# Patient Record
Sex: Female | Born: 1945 | Race: White | Hispanic: No | Smoking: Former smoker
Health system: Southern US, Community
[De-identification: ages and names within clinical notes are randomized; demographics above are authoritative.]

## PROBLEM LIST (undated history)

## (undated) DIAGNOSIS — I10 Essential (primary) hypertension: Secondary | ICD-10-CM

## (undated) DIAGNOSIS — Z972 Presence of dental prosthetic device (complete) (partial): Secondary | ICD-10-CM

## (undated) DIAGNOSIS — E785 Hyperlipidemia, unspecified: Secondary | ICD-10-CM

## (undated) DIAGNOSIS — E119 Type 2 diabetes mellitus without complications: Secondary | ICD-10-CM

## (undated) DIAGNOSIS — R42 Dizziness and giddiness: Secondary | ICD-10-CM

## (undated) DIAGNOSIS — I739 Peripheral vascular disease, unspecified: Secondary | ICD-10-CM

## (undated) DIAGNOSIS — T753XXA Motion sickness, initial encounter: Secondary | ICD-10-CM

## (undated) DIAGNOSIS — Z87891 Personal history of nicotine dependence: Principal | ICD-10-CM

## (undated) DIAGNOSIS — J439 Emphysema, unspecified: Secondary | ICD-10-CM

## (undated) DIAGNOSIS — J449 Chronic obstructive pulmonary disease, unspecified: Secondary | ICD-10-CM

## (undated) HISTORY — DX: Essential (primary) hypertension: I10

## (undated) HISTORY — DX: Peripheral vascular disease, unspecified: I73.9

## (undated) HISTORY — DX: Emphysema, unspecified: J43.9

## (undated) HISTORY — DX: Chronic obstructive pulmonary disease, unspecified: J44.9

## (undated) HISTORY — DX: Personal history of nicotine dependence: Z87.891

## (undated) HISTORY — DX: Hyperlipidemia, unspecified: E78.5

---

## 1965-03-13 HISTORY — PX: APPENDECTOMY: SHX54

## 1978-03-13 HISTORY — PX: ABDOMINAL HYSTERECTOMY: SHX81

## 2000-01-16 ENCOUNTER — Other Ambulatory Visit: Admission: RE | Admit: 2000-01-16 | Discharge: 2000-01-16 | Payer: Self-pay | Admitting: Family Medicine

## 2003-08-19 ENCOUNTER — Encounter: Admission: RE | Admit: 2003-08-19 | Discharge: 2003-08-19 | Payer: Self-pay | Admitting: Family Medicine

## 2004-10-11 ENCOUNTER — Other Ambulatory Visit: Admission: RE | Admit: 2004-10-11 | Discharge: 2004-10-11 | Payer: Self-pay | Admitting: Family Medicine

## 2004-11-03 ENCOUNTER — Ambulatory Visit: Payer: Self-pay | Admitting: Urology

## 2005-07-13 ENCOUNTER — Ambulatory Visit (HOSPITAL_COMMUNITY): Admission: RE | Admit: 2005-07-13 | Discharge: 2005-07-13 | Payer: Self-pay | Admitting: Family Medicine

## 2005-07-13 ENCOUNTER — Encounter: Payer: Self-pay | Admitting: Vascular Surgery

## 2006-10-12 HISTORY — PX: NM MYOCAR PERF WALL MOTION: HXRAD629

## 2006-11-05 ENCOUNTER — Encounter: Admission: RE | Admit: 2006-11-05 | Discharge: 2006-11-05 | Payer: Self-pay | Admitting: Cardiovascular Disease

## 2006-11-15 ENCOUNTER — Ambulatory Visit (HOSPITAL_COMMUNITY): Admission: RE | Admit: 2006-11-15 | Discharge: 2006-11-15 | Payer: Self-pay | Admitting: Physician Assistant

## 2006-11-22 ENCOUNTER — Ambulatory Visit (HOSPITAL_COMMUNITY): Admission: RE | Admit: 2006-11-22 | Discharge: 2006-11-22 | Payer: Self-pay | Admitting: Cardiovascular Disease

## 2006-12-19 ENCOUNTER — Observation Stay (HOSPITAL_COMMUNITY): Admission: RE | Admit: 2006-12-19 | Discharge: 2006-12-20 | Payer: Self-pay | Admitting: Cardiovascular Disease

## 2006-12-19 HISTORY — PX: ABDOMINAL AORTA STENT: SHX1108

## 2010-07-26 NOTE — Discharge Summary (Signed)
NAME:  [name] [name]                ACCOUNT NO.:  [id number]   MEDICAL RECORD NO.:  [id number]          PATIENT TYPE:  OBV   LOCATION:  6533                         FACILITY:  [facility]   PHYSICIAN:  Nanetta Batty, M.D.   DATE OF BIRTH:  [birth year]   DATE OF ADMISSION:  12/19/2006  DATE OF DISCHARGE:  12/20/2006                               DISCHARGE SUMMARY   DISCHARGE DIAGNOSES:  1. Claudication secondary to #2.  2. 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     PTA and stent to the distal abdominal aorta by Dr. Allyson Sabal  3. Hypertension.  4. Hyperlipidemia.  5. Gastroesophageal reflux disease  6. History of chronic hematuria.  6,  Tobacco abuse, but this was down to three cigarettes and plans to  stop cold [place].   DISCHARGE CONDITION:  Stable.   PROCEDURES:  PTA and stent to the distal aorta by Dr. Allyson Sabal and Dr.  Jacinto Halim.   DISCHARGE MEDICATIONS:  1. Lisinopril 10 mg daily.  2. Crestor 10 mg daily  3. Omeprazole 20 mg daily.  4. Enablex 15 mg daily  5. Multivitamin daily.  6. Aspirin 325 daily.  Stop Plavix 75 mg daily, new   DISCHARGE INSTRUCTIONS:  1. Increase activity slowly.  May shower or bathe.  No lifting for 2      days, no driving for 2 days, no sexual activity for 2 days  2. Low-sodium heart-healthy diet.  3. Wash cath site with soap and water.  Call if any bleeding, swelling      or drainage.  4. Follow up with Dr. Allyson Sabal in 2 weeks, office will call with date and      time and the office will also call to schedule you for lower      extremity arterial Dopplers, ABIs and abdominal aorta Doppler.   HISTORY OF PRESENT ILLNESS:  This is a 65 year old white female patient  referred to Dr. Allyson Sabal by Dr. Al Corpus secondary to abnormal  Doppler's.  She had had 2 years of claudication complaints.  She would start walking  and it would go from the ankles up into the hips, burning and pulling  pain in both legs.   Initially she thought it was worse in the left but  now it is pretty equal bilaterally.  She can only walk 50 yards before  she has to stop and rest. It never occurs at rest, only with exertion.  She had bilateral ABIs and Dopplers and distal abdominal aorta suggest  stenosis of greater than 78% diameter reduction.   The patient was seen by Dr. Allyson Sabal in August, underwent PV abdominal  aortogram by Dr. Allyson Sabal on September 4 which revealed intimal flap  hemodynamically significant with claudication in the distal aorta and  she would need stenting.   Additionally the patient had complained of some chest burning.  She  underwent a Persantine Myoview which was negative for ischemia.  EF was  77%.   Past medical history as above.  Additionally, hypertension,  hyperlipidemia,  gastroesophageal reflux disease and chronic hematuria.   Review of systems, outpatient medications, family history, social  history please see H&P of November 05, 2006.   ALLERGIES:  To CODEINE.   PHYSICAL EXAM AT DISCHARGE:  Blood pressure 119/44, heart rate 60,  temperature 97.8,  right groin stable.  No hematoma.  Lungs were clear.  Heart: Normal S1-S2.   LABORATORY DATA:  Postprocedure hemoglobin 13.9, hematocrit 40.3, WBC  8.9, platelets 204, MCV 89.1.  Chemistry, sodium 141, potassium 4.6,  chloride 107, CO2 30, BUN 11, creatinine 0.85, glucose 115 and calcium  9.4.   HOSPITAL COURSE:  The patient presented, underwent PTA and stent to the  distal aorta and tolerated the procedure well.  By the next morning she  was ambulating without problems.  Next morning stable and ready for  discharge home.  She will follow up with Dr. Allyson Sabal and her  primary care  of Dr. Lianne Bushy.      Darcella Gasman. Annie Paras, N.P.      Nanetta Batty, M.D.  Electronically Signed    LRI/MEDQ  D:  12/20/2006  T:  12/20/2006  Job:  [id number]   cc:   Lianne Bushy, M.D.

## 2010-07-26 NOTE — Cardiovascular Report (Signed)
NAME:  [name] [name], [name] [name]                ACCOUNT NO.:  [id number]   MEDICAL RECORD NO.:  [id number]          PATIENT TYPE:  AMB   LOCATION:  SDS                          FACILITY:  [facility]   PHYSICIAN:  Nanetta Batty, M.D.   DATE OF BIRTH:  [birth year]   DATE OF PROCEDURE:  DATE OF DISCHARGE:                            CARDIAC CATHETERIZATION   [name] [name] is a 65 year old female referred to me for claudication.  She  had Dopplers which revealed ABIs in the 0.7-0.8 range with a high  frequency signal in the distal aorta.  Her other problems include  hypertension, hyperlipidemia and family history.  She had a negative  Myoview.  She presents now for angiography and potential intervention.   DESCRIPTION OF PROCEDURE:  The patient brought to the second floor [facility] Suite in the postabsorptive state.  She is  premedicated p.o. Valium.  Her right groin was prepped and shaved in the  usual sterile fashion, 1% Xylocaine was used for local anesthesia.  A 5-  French sheath was inserted into the right femoral artery using standard  Seldinger technique.  A 5-French tennis racket catheter was used for  midstream and distal abdominal aortography with distal runoff using  bolus chase digital subtraction step table technique.  Visipaque dye was  used for the entirety of the case.  Aortic pressures monitored in the  case.   ANGIOGRAPHIC RESULTS:  1. Abdominal aorta.      a.     Renal arteries:  Normal.      b.     Infrarenal abdominal aorta:  There was a filling defect in       the distal aorta which appeared to be an intimal flap which was       mobile and moved with systolic and diastole.      c.     Iliac bifurcation:  No evidence of disease.  2. Left lower extremity:  Normal with two-vessel runoff.  3. Right lower extremity:  Normal with three-vessel runoff.   IMPRESSION:  [name] [name] has a freely mobile though attached intimal flap  in the distal aorta, probably responsible for  her claudication.  She  does have a high frequency signal by duplex ultrasound.  A pullback  gradient across this using a 5-French end-hole catheter after  administration of 200 mcg intra-arterial nitroglycerin revealed a 30-35  mm gradient.   Probably significant intimal flap with obstruction of flow.  We will  proceed with intravascular ultrasound.   The sheath was upgraded to a [place] sheath.  The IVUS catheter was  advanced past the lesion and a pullback was performed.  There was an  intimal flap noted.  The aortic measurements at this level were 14 x 14  mm.  Slightly more distally, the aorta tapered down to 10 mm.   IMPRESSION:  Intimal flap hemodynamically significant with claudication.  The patient is a good candidate to undergo stenting using a nitinol 14 x  40 mm self-expanding stent.  I am going to review the angiographic and  IVUS pictures with my partners prior  to scheduling this.   Sheath was removed and pressure to the groin to achieve hemostasis.  The  patient left the lab in stable condition.  She will be discharged home  as an outpatient today and will see me back in the office next week in  followup.  She left the lab in stable condition.      Nanetta Batty, M.D.  Electronically Signed     JB/MEDQ  D:  11/15/2006  T:  11/15/2006  Job:  [id number]   cc:   Second Floor [place] [facility]  [facility]  Max T. Hyatt, D.P.M.  Lianne Bushy, M.D.

## 2010-07-26 NOTE — Cardiovascular Report (Signed)
NAME:  [name] [name], [name] [name]                ACCOUNT NO.:  [id number]   MEDICAL RECORD NO.:  [id number]          PATIENT TYPE:  OBV   LOCATION:  2807                         FACILITY:  [facility]   PHYSICIAN:  Nanetta Batty, M.D.   DATE OF BIRTH:  1946-01-07   DATE OF PROCEDURE:  12/19/2006  DATE OF DISCHARGE:                            CARDIAC CATHETERIZATION   HISTORY OF PRESENT ILLNESS:  Ms. Mumme is a 65 year old female referred  by Dr. Purnell Shoemaker for claudication.  Abdominal aortogram revealed distal  abdominal aortic flap which is confirmed by IBIS, pullback gradient and  CT angio.  She presents now for stenting using a nitinol self-expanding  stent and angioplasty.   DESCRIPTION OF PROCEDURE:  The patient brought to the second floor [facility] PV angiographic suite in postabsorptive state.  She was  premedicated with p.o. Valium, IV Versed and fentanyl.  Right groin was  prepped and shaved in the usual sterile fashion, and 1% Xylocaine was  used for local anesthesia.  An 8-French long bright tip sheath was  inserted into the right femoral artery using standard Seldinger  technique.  A 5-French pigtail catheter was used for abdominal  aortography using digital subtraction technique.  The patient received  5000 units of heparin intravenously.  Visipaque dye was used throughout  the entirety of the case.  Retrograde aortic pressure was monitored  during the case.   Using a 4 x 14 Smart nitinol self-expanding stent.  Stenting was  performed.  This was postdilated with a 12.2 Powerflex at 4 atmospheres  and post procedure IBIS.  Interrogation revealed the stent be adequately  opposed.  The residual lumen at the site of the lesion was approximately  9 x 11 mm.   IMPRESSION:  Successful PTA and stenting using a nitinol self-expanding  stent with IBIS guidance.  The patient tolerated procedure well.  There  was a long sheath exchanged for short 8-French sheath.  It was sewn  securely in place.   ACT was greater than 200.  It will be removed once  ACT falls below 150.  The patient will be discharged home in the morning  with follow-up Dopplers and ABIs . We will see her back in the office.      Nanetta Batty, M.D.  Electronically Signed     JB/MEDQ  D:  12/19/2006  T:  12/19/2006  Job:  [id number]   cc:   Lianne Bushy, M.D.  [facility] [employer]

## 2010-12-22 LAB — BASIC METABOLIC PANEL
BUN: 11
CO2: 30
Calcium: 9.4
Chloride: 107
Creatinine, Ser: 0.85
Glucose, Bld: 115 — ABNORMAL HIGH

## 2010-12-22 LAB — CBC
MCHC: 34.6
MCV: 89.1
RDW: 12.9
WBC: 8.9

## 2010-12-23 LAB — BASIC METABOLIC PANEL
Calcium: 9
Chloride: 105
GFR calc non Af Amer: >60

## 2010-12-23 LAB — CBC
Hemoglobin: 14.5
MCV: 88
RDW: 12.7

## 2010-12-23 LAB — PROTIME-INR: INR: 0.9

## 2011-04-18 DIAGNOSIS — Z1231 Encounter for screening mammogram for malignant neoplasm of breast: Secondary | ICD-10-CM | POA: Diagnosis not present

## 2011-04-24 DIAGNOSIS — N6489 Other specified disorders of breast: Secondary | ICD-10-CM | POA: Diagnosis not present

## 2011-04-24 DIAGNOSIS — R928 Other abnormal and inconclusive findings on diagnostic imaging of breast: Secondary | ICD-10-CM | POA: Diagnosis not present

## 2011-05-24 DIAGNOSIS — I739 Peripheral vascular disease, unspecified: Secondary | ICD-10-CM | POA: Diagnosis not present

## 2011-05-29 DIAGNOSIS — I739 Peripheral vascular disease, unspecified: Secondary | ICD-10-CM | POA: Diagnosis not present

## 2011-05-29 DIAGNOSIS — I10 Essential (primary) hypertension: Secondary | ICD-10-CM | POA: Diagnosis not present

## 2011-05-29 DIAGNOSIS — E782 Mixed hyperlipidemia: Secondary | ICD-10-CM | POA: Diagnosis not present

## 2011-08-21 DIAGNOSIS — R7309 Other abnormal glucose: Secondary | ICD-10-CM | POA: Diagnosis not present

## 2011-08-21 DIAGNOSIS — E041 Nontoxic single thyroid nodule: Secondary | ICD-10-CM | POA: Diagnosis not present

## 2011-08-21 DIAGNOSIS — I10 Essential (primary) hypertension: Secondary | ICD-10-CM | POA: Diagnosis not present

## 2011-08-21 DIAGNOSIS — E782 Mixed hyperlipidemia: Secondary | ICD-10-CM | POA: Diagnosis not present

## 2011-08-22 DIAGNOSIS — D485 Neoplasm of uncertain behavior of skin: Secondary | ICD-10-CM | POA: Diagnosis not present

## 2011-08-22 DIAGNOSIS — H2510 Age-related nuclear cataract, unspecified eye: Secondary | ICD-10-CM | POA: Diagnosis not present

## 2011-08-30 DIAGNOSIS — R918 Other nonspecific abnormal finding of lung field: Secondary | ICD-10-CM | POA: Diagnosis not present

## 2011-08-30 DIAGNOSIS — J438 Other emphysema: Secondary | ICD-10-CM | POA: Diagnosis not present

## 2011-08-30 DIAGNOSIS — I2510 Atherosclerotic heart disease of native coronary artery without angina pectoris: Secondary | ICD-10-CM | POA: Diagnosis not present

## 2011-08-30 DIAGNOSIS — K449 Diaphragmatic hernia without obstruction or gangrene: Secondary | ICD-10-CM | POA: Diagnosis not present

## 2011-11-15 DIAGNOSIS — E042 Nontoxic multinodular goiter: Secondary | ICD-10-CM | POA: Diagnosis not present

## 2012-01-12 DIAGNOSIS — Z23 Encounter for immunization: Secondary | ICD-10-CM | POA: Diagnosis not present

## 2012-02-21 DIAGNOSIS — I10 Essential (primary) hypertension: Secondary | ICD-10-CM | POA: Diagnosis not present

## 2012-02-21 DIAGNOSIS — E782 Mixed hyperlipidemia: Secondary | ICD-10-CM | POA: Diagnosis not present

## 2012-02-21 DIAGNOSIS — M810 Age-related osteoporosis without current pathological fracture: Secondary | ICD-10-CM | POA: Diagnosis not present

## 2012-02-21 DIAGNOSIS — R7309 Other abnormal glucose: Secondary | ICD-10-CM | POA: Diagnosis not present

## 2012-03-19 DIAGNOSIS — R918 Other nonspecific abnormal finding of lung field: Secondary | ICD-10-CM | POA: Diagnosis not present

## 2012-04-28 ENCOUNTER — Emergency Department: Payer: Self-pay | Admitting: Emergency Medicine

## 2012-04-28 DIAGNOSIS — Z79899 Other long term (current) drug therapy: Secondary | ICD-10-CM | POA: Diagnosis not present

## 2012-04-28 DIAGNOSIS — R5381 Other malaise: Secondary | ICD-10-CM | POA: Diagnosis not present

## 2012-04-28 DIAGNOSIS — R42 Dizziness and giddiness: Secondary | ICD-10-CM | POA: Diagnosis not present

## 2012-04-28 DIAGNOSIS — Z9861 Coronary angioplasty status: Secondary | ICD-10-CM | POA: Diagnosis not present

## 2012-04-28 LAB — URINALYSIS, COMPLETE
Bacteria: NONE SEEN
Bilirubin,UR: NEGATIVE
Ketone: NEGATIVE
Ph: 6 (ref 4.5–8.0)
Protein: NEGATIVE
RBC,UR: <1 /HPF (ref 0–5)
WBC UR: <1 /HPF (ref 0–5)

## 2012-04-28 LAB — CBC
HGB: 14.6 g/dL (ref 12.0–16.0)
MCH: 30.2 pg (ref 26.0–34.0)
MCV: 89 fL (ref 80–100)
RBC: 4.85 10*6/uL (ref 3.80–5.20)
WBC: 9.7 10*3/uL (ref 3.6–11.0)

## 2012-04-28 LAB — COMPREHENSIVE METABOLIC PANEL
Albumin: 4.1 g/dL (ref 3.4–5.0)
Anion Gap: 9 (ref 7–16)
Chloride: 112 mmol/L — ABNORMAL HIGH (ref 98–107)
EGFR (African American): >60
Glucose: 94 mg/dL (ref 65–99)
Osmolality: 284 (ref 275–301)
SGOT(AST): 22 U/L (ref 15–37)
SGPT (ALT): 21 U/L (ref 12–78)
Sodium: 143 mmol/L (ref 136–145)
Total Protein: 7.7 g/dL (ref 6.4–8.2)

## 2012-05-03 ENCOUNTER — Other Ambulatory Visit (HOSPITAL_COMMUNITY): Payer: Self-pay | Admitting: Cardiology

## 2012-05-03 DIAGNOSIS — E782 Mixed hyperlipidemia: Secondary | ICD-10-CM | POA: Diagnosis not present

## 2012-05-03 DIAGNOSIS — I10 Essential (primary) hypertension: Secondary | ICD-10-CM | POA: Diagnosis not present

## 2012-05-03 DIAGNOSIS — I739 Peripheral vascular disease, unspecified: Secondary | ICD-10-CM

## 2012-05-03 DIAGNOSIS — I7389 Other specified peripheral vascular diseases: Secondary | ICD-10-CM | POA: Diagnosis not present

## 2012-05-08 DIAGNOSIS — Z13820 Encounter for screening for osteoporosis: Secondary | ICD-10-CM | POA: Diagnosis not present

## 2012-05-08 DIAGNOSIS — Z1231 Encounter for screening mammogram for malignant neoplasm of breast: Secondary | ICD-10-CM | POA: Diagnosis not present

## 2012-05-08 DIAGNOSIS — N958 Other specified menopausal and perimenopausal disorders: Secondary | ICD-10-CM | POA: Diagnosis not present

## 2012-05-08 DIAGNOSIS — N951 Menopausal and female climacteric states: Secondary | ICD-10-CM | POA: Diagnosis not present

## 2012-05-08 DIAGNOSIS — E2839 Other primary ovarian failure: Secondary | ICD-10-CM | POA: Diagnosis not present

## 2012-06-11 ENCOUNTER — Encounter (HOSPITAL_COMMUNITY): Payer: Self-pay

## 2012-08-21 DIAGNOSIS — Z9181 History of falling: Secondary | ICD-10-CM | POA: Diagnosis not present

## 2012-08-21 DIAGNOSIS — E782 Mixed hyperlipidemia: Secondary | ICD-10-CM | POA: Diagnosis not present

## 2012-08-21 DIAGNOSIS — Z6826 Body mass index (BMI) 26.0-26.9, adult: Secondary | ICD-10-CM | POA: Diagnosis not present

## 2012-08-21 DIAGNOSIS — I10 Essential (primary) hypertension: Secondary | ICD-10-CM | POA: Diagnosis not present

## 2012-08-21 DIAGNOSIS — E042 Nontoxic multinodular goiter: Secondary | ICD-10-CM | POA: Diagnosis not present

## 2012-08-21 DIAGNOSIS — Z1331 Encounter for screening for depression: Secondary | ICD-10-CM | POA: Diagnosis not present

## 2012-08-21 DIAGNOSIS — R7309 Other abnormal glucose: Secondary | ICD-10-CM | POA: Diagnosis not present

## 2012-08-26 DIAGNOSIS — E119 Type 2 diabetes mellitus without complications: Secondary | ICD-10-CM | POA: Diagnosis not present

## 2012-09-10 DIAGNOSIS — H2510 Age-related nuclear cataract, unspecified eye: Secondary | ICD-10-CM | POA: Diagnosis not present

## 2012-09-11 ENCOUNTER — Ambulatory Visit (HOSPITAL_COMMUNITY)
Admission: RE | Admit: 2012-09-11 | Discharge: 2012-09-11 | Disposition: A | Discharge status: Home / Self Care | Payer: Medicare Other | Source: Ambulatory Visit | Attending: Internal Medicine | Admitting: Internal Medicine

## 2012-09-11 ENCOUNTER — Encounter (HOSPITAL_COMMUNITY): Payer: Self-pay

## 2012-09-11 DIAGNOSIS — I739 Peripheral vascular disease, unspecified: Secondary | ICD-10-CM | POA: Insufficient documentation

## 2012-09-11 DIAGNOSIS — I70219 Atherosclerosis of native arteries of extremities with intermittent claudication, unspecified extremity: Secondary | ICD-10-CM

## 2012-09-11 NOTE — Progress Notes (Signed)
Arterial Duplex Lower Ext. Completed. Zoua Caporaso, RDMS, RVT  

## 2012-09-16 ENCOUNTER — Ambulatory Visit: Payer: Self-pay | Admitting: Family Medicine

## 2012-09-16 DIAGNOSIS — E119 Type 2 diabetes mellitus without complications: Secondary | ICD-10-CM | POA: Diagnosis not present

## 2012-09-16 DIAGNOSIS — Z7189 Other specified counseling: Secondary | ICD-10-CM | POA: Diagnosis not present

## 2012-10-11 ENCOUNTER — Ambulatory Visit: Payer: Self-pay | Admitting: Family Medicine

## 2012-10-11 DIAGNOSIS — Z7189 Other specified counseling: Secondary | ICD-10-CM | POA: Diagnosis not present

## 2012-10-11 DIAGNOSIS — E119 Type 2 diabetes mellitus without complications: Secondary | ICD-10-CM | POA: Diagnosis not present

## 2012-11-11 ENCOUNTER — Ambulatory Visit: Payer: Self-pay | Admitting: Family Medicine

## 2012-11-27 DIAGNOSIS — E782 Mixed hyperlipidemia: Secondary | ICD-10-CM | POA: Diagnosis not present

## 2012-11-27 DIAGNOSIS — E119 Type 2 diabetes mellitus without complications: Secondary | ICD-10-CM | POA: Diagnosis not present

## 2012-11-27 DIAGNOSIS — I10 Essential (primary) hypertension: Secondary | ICD-10-CM | POA: Diagnosis not present

## 2013-01-23 DIAGNOSIS — Z23 Encounter for immunization: Secondary | ICD-10-CM | POA: Diagnosis not present

## 2013-03-31 DIAGNOSIS — I10 Essential (primary) hypertension: Secondary | ICD-10-CM | POA: Diagnosis not present

## 2013-03-31 DIAGNOSIS — L989 Disorder of the skin and subcutaneous tissue, unspecified: Secondary | ICD-10-CM | POA: Diagnosis not present

## 2013-03-31 DIAGNOSIS — E782 Mixed hyperlipidemia: Secondary | ICD-10-CM | POA: Diagnosis not present

## 2013-03-31 DIAGNOSIS — E119 Type 2 diabetes mellitus without complications: Secondary | ICD-10-CM | POA: Diagnosis not present

## 2013-03-31 DIAGNOSIS — Z9181 History of falling: Secondary | ICD-10-CM | POA: Diagnosis not present

## 2013-03-31 DIAGNOSIS — Z1331 Encounter for screening for depression: Secondary | ICD-10-CM | POA: Diagnosis not present

## 2013-04-04 DIAGNOSIS — C44621 Squamous cell carcinoma of skin of unspecified upper limb, including shoulder: Secondary | ICD-10-CM | POA: Diagnosis not present

## 2013-04-04 DIAGNOSIS — D485 Neoplasm of uncertain behavior of skin: Secondary | ICD-10-CM | POA: Diagnosis not present

## 2013-05-27 DIAGNOSIS — Z1231 Encounter for screening mammogram for malignant neoplasm of breast: Secondary | ICD-10-CM | POA: Diagnosis not present

## 2013-05-28 DIAGNOSIS — Z6825 Body mass index (BMI) 25.0-25.9, adult: Secondary | ICD-10-CM | POA: Diagnosis not present

## 2013-05-28 DIAGNOSIS — C44621 Squamous cell carcinoma of skin of unspecified upper limb, including shoulder: Secondary | ICD-10-CM | POA: Diagnosis not present

## 2013-05-28 DIAGNOSIS — D485 Neoplasm of uncertain behavior of skin: Secondary | ICD-10-CM | POA: Diagnosis not present

## 2013-06-17 DIAGNOSIS — E119 Type 2 diabetes mellitus without complications: Secondary | ICD-10-CM | POA: Diagnosis not present

## 2013-06-23 IMAGING — CT CT HEAD WITHOUT CONTRAST
1 series · 16 of 30 positions shown, 20 images · non-contrast
Comparison: none

REASON FOR EXAM: dizzy
COMMENTS:

PROCEDURE:     CT  - CT HEAD WITHOUT CONTRAST  - April 28, 2012  [DATE]
RESULT:     Comparison:  None
TECHNIQUE: Multiple axial images from the foramen magnum to the vertex were
obtained without IV contrast.

[Series 2: soft tissue · axial · 0.44mm/px · z∈[-176,-42]mm · 16 of 31 slices shown, 20 images]
[im 2/31  brain]
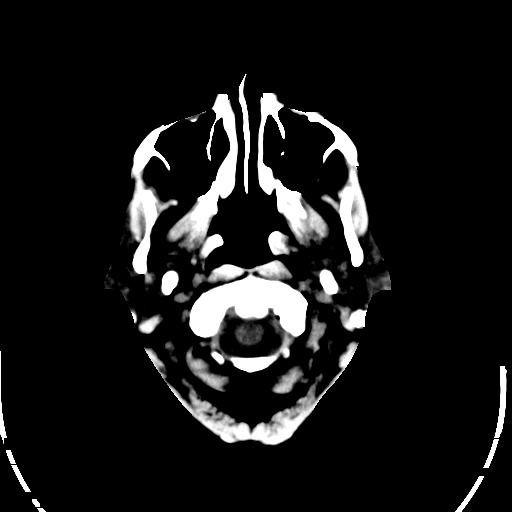
[im 2/31  bone]
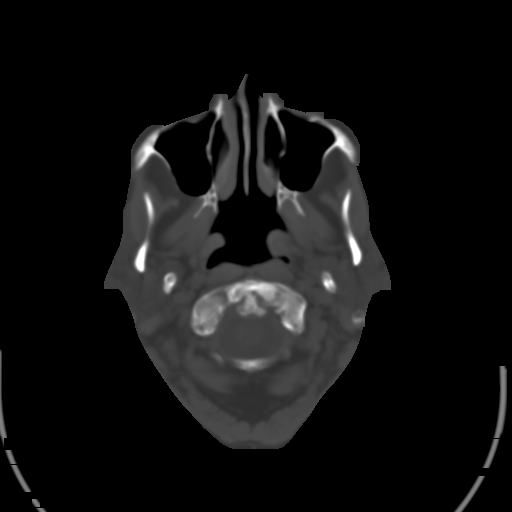
[im 4/31  brain]
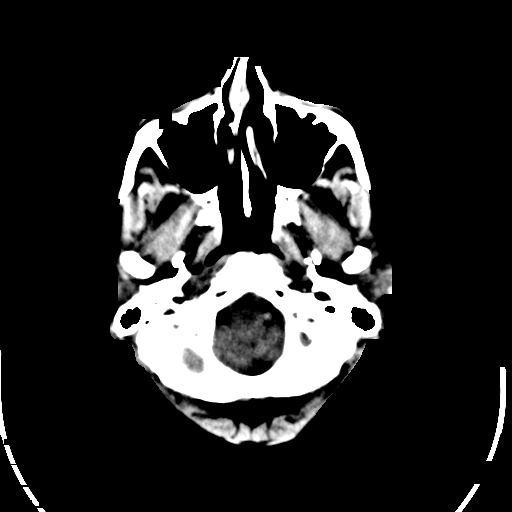
[im 6/31  brain]
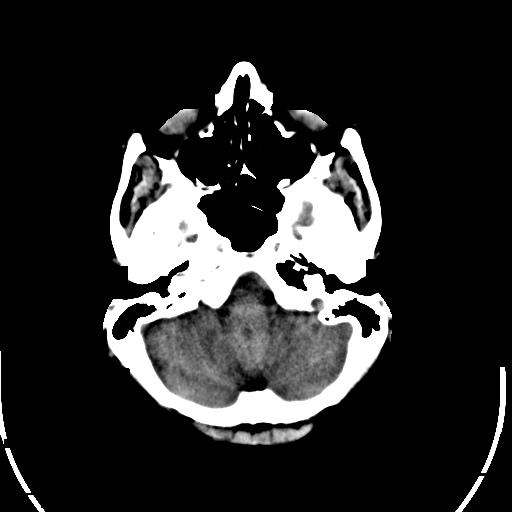
[im 8/31  brain]
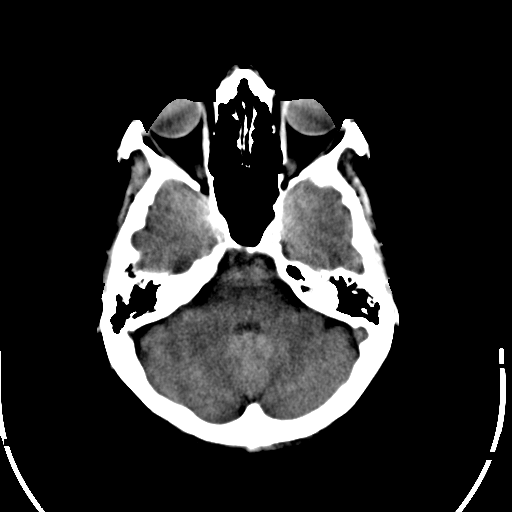
[im 9/31  brain]
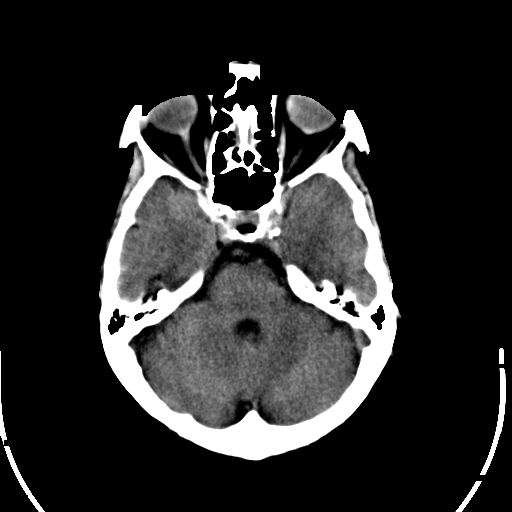
[im 9/31  bone]
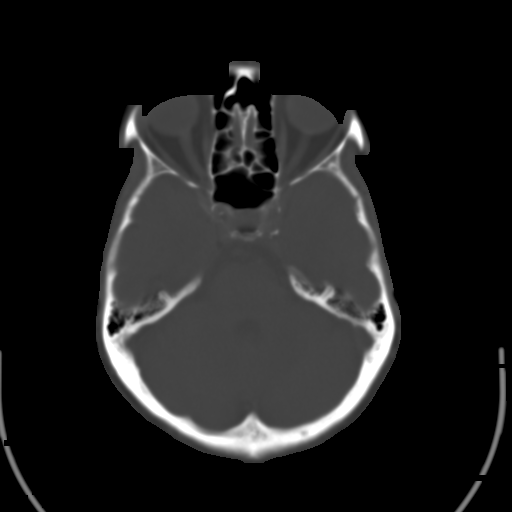
[im 11/31  brain]
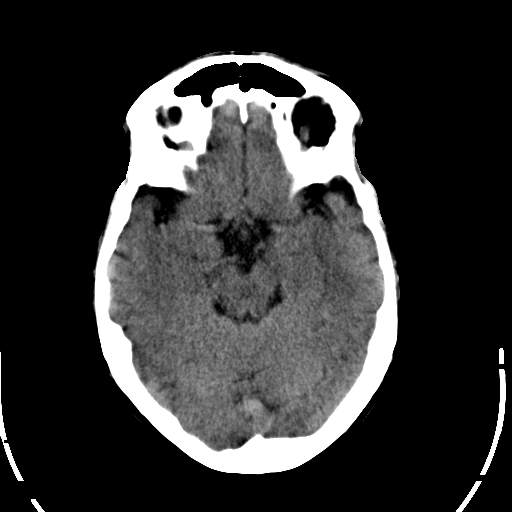
[im 13/31  brain]
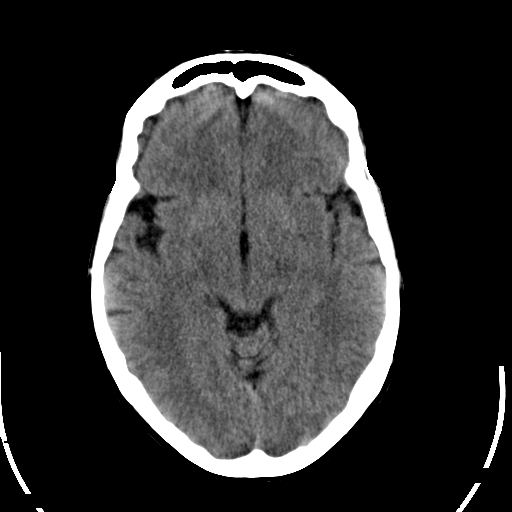
[im 15/31  brain]
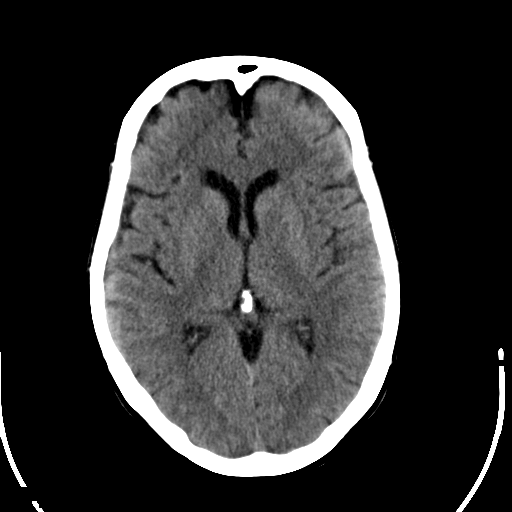
[im 16/31  brain]
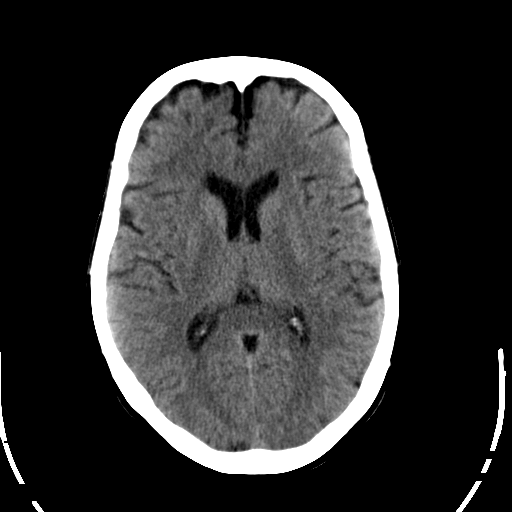
[im 16/31  bone]
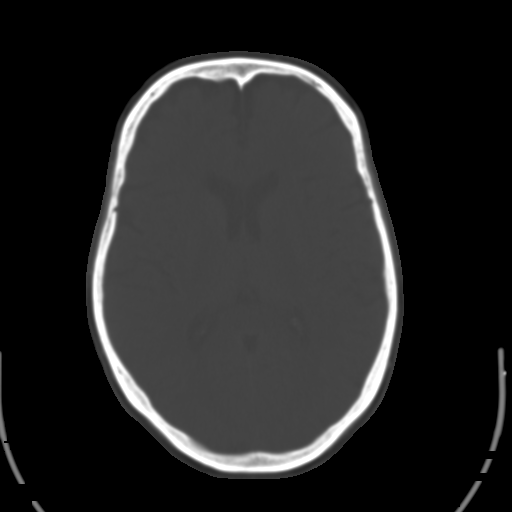
[im 18/31  brain]
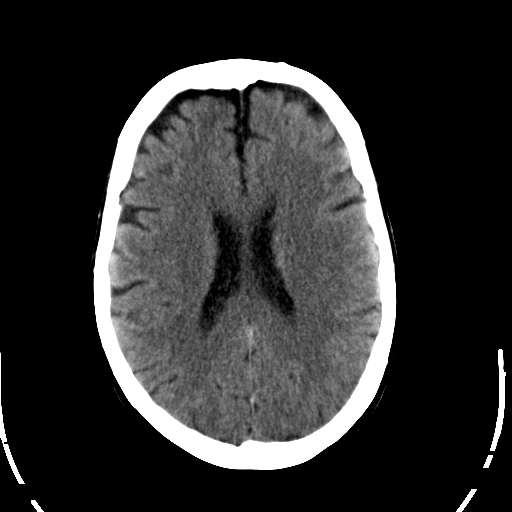
[im 20/31  brain]
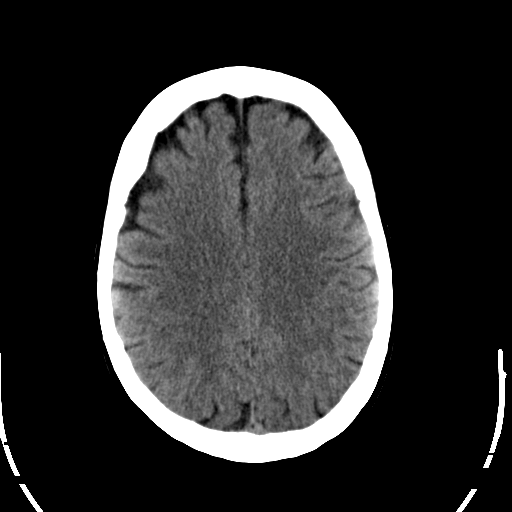
[im 22/31  brain]
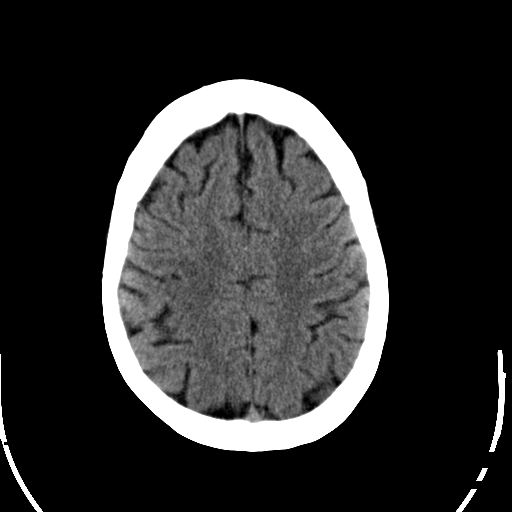
[im 23/31  brain]
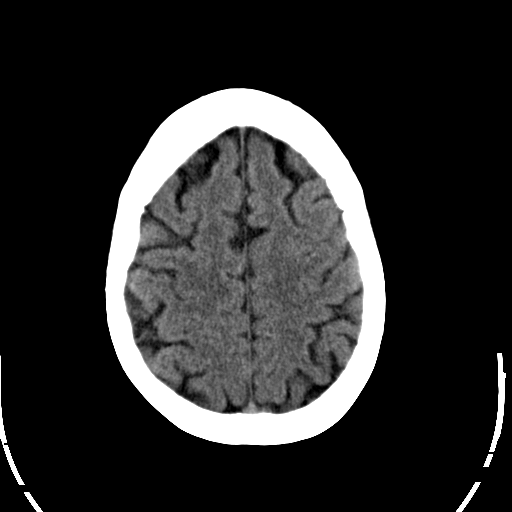
[im 23/31  bone]
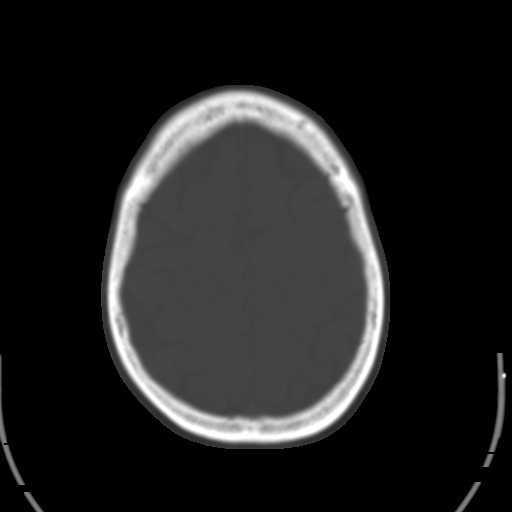
[im 25/31  brain]
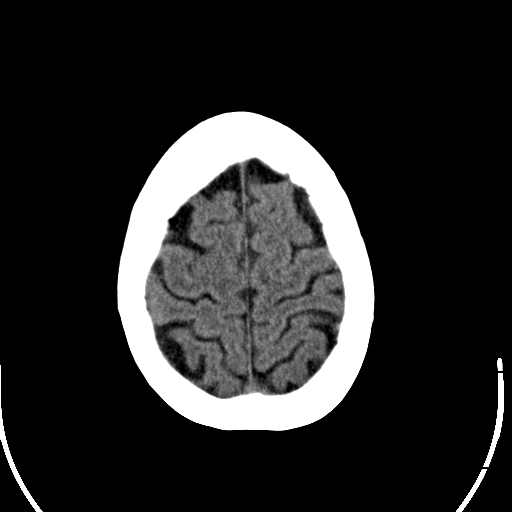
[im 27/31  brain]
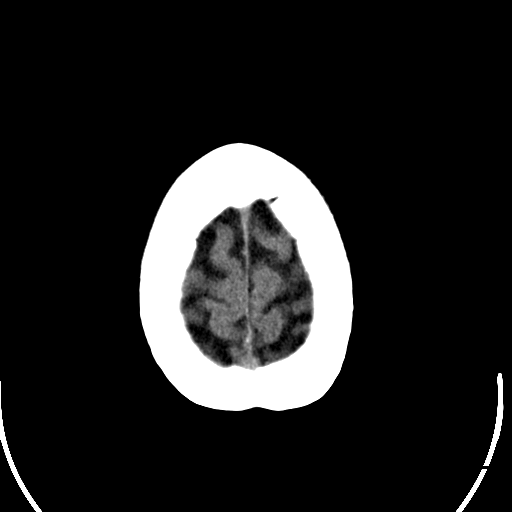
[im 29/31  brain]
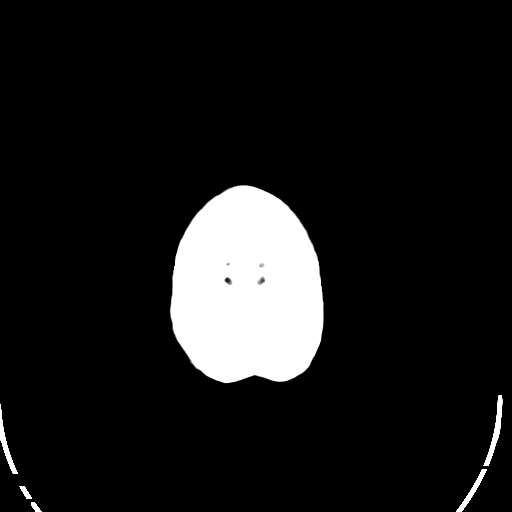

[16 of 30 positions shown; findings below may reference images not displayed]

FINDINGS: There is no evidence of mass effect, midline shift, or extra-axial fluid
collections.  There is no evidence of a space-occupying lesion or
intracranial hemorrhage. There is no evidence of a cortical-based area of
acute infarction. There is generalized cerebral atrophy. There is
periventricular white matter low attenuation likely secondary to
microangiopathy.

The ventricles and sulci are appropriate for the patient's age. The basal
cisterns are patent.

Visualized portions of the orbits are unremarkable. The visualized portions
of the paranasal sinuses and mastoid air cells are unremarkable.
Cerebrovascular atherosclerotic calcifications are noted.

The osseous structures are unremarkable.
IMPRESSION: No acute intracranial process.

[REDACTED]

## 2013-06-25 ENCOUNTER — Ambulatory Visit: Payer: Medicare Other | Admitting: Cardiovascular Disease

## 2013-07-15 ENCOUNTER — Encounter: Payer: Self-pay | Admitting: *Deleted

## 2013-07-16 ENCOUNTER — Ambulatory Visit (INDEPENDENT_AMBULATORY_CARE_PROVIDER_SITE_OTHER): Payer: Medicare Other | Admitting: Cardiovascular Disease

## 2013-07-16 ENCOUNTER — Encounter: Payer: Self-pay | Admitting: Cardiovascular Disease

## 2013-07-16 VITALS — BP 122/60 | HR 68 | Ht 64.0 in | Wt 151.3 lb

## 2013-07-16 DIAGNOSIS — I10 Essential (primary) hypertension: Secondary | ICD-10-CM | POA: Diagnosis not present

## 2013-07-16 DIAGNOSIS — E785 Hyperlipidemia, unspecified: Secondary | ICD-10-CM | POA: Insufficient documentation

## 2013-07-16 DIAGNOSIS — I739 Peripheral vascular disease, unspecified: Secondary | ICD-10-CM | POA: Insufficient documentation

## 2013-07-16 NOTE — Assessment & Plan Note (Signed)
Status post distal abdominal aortic stenting by myself 12/19/06 with a megadose of expanding stent because of a distal abdominal aortic dissection with ultimate improvement in her symptoms and Dopplers. She currently denies claudication

## 2013-07-16 NOTE — Assessment & Plan Note (Signed)
On statin therapy followed by her PCP 

## 2013-07-16 NOTE — Assessment & Plan Note (Signed)
Controlled on current medications 

## 2013-07-16 NOTE — Progress Notes (Signed)
07/16/2013 [name] [name]   06/18/1945  [id number]  Primary Physician Leonides Sake, MD Primary Cardiologist: Lorretta Harp MD [name] [name]   HPI:  The patient is a 68 year old, thin-appearing, widowed Caucasian female, mother of 1 child, grandmother to 2 grandchildren who I saw approximately 2 years ago. She has a history of discontinued tobacco abuse, having stopped on September 10, 2009. She did have 45-pack-year history prior to that. She has hypertension and hyperlipidemia as well. Because of claudication, I angiogram'd her on November 15, 2006, revealing a distal abdominal aortic flap which I ultimately stented 1 month later with a 4-x-14 SMART Nitinol self-expanding stent. Followup Dopplers were normal and her claudication resolved. Dr. Lisbeth Ply follows her lipid profile. She does get short of breath probably related to COPD. She had a Myoview stress test performed in 2008 which was normal. Dopplers performed May 24, 2011, revealed ABIs of 1 bilaterally with normal abdominal aortic waveform. Since I saw her 2 years ago she denies chest pain, shortness of breath or claudication.     Current Outpatient Prescriptions  Medication Sig Dispense Refill  . alendronate (FOSAMAX) 70 MG tablet Take 70 mg by mouth once a week. Take with a full glass of water on an empty stomach.      [name] aspirin 325 MG tablet Take 325 mg by mouth daily.      . clopidogrel (PLAVIX) 75 MG tablet Take 75 mg by mouth daily with breakfast.      . fenofibrate micronized (LOFIBRA) 134 MG capsule Take 134 mg by mouth daily before breakfast.      . losartan (COZAAR) 25 MG tablet Take 25 mg by mouth daily.      . meclizine (ANTIVERT) 12.5 MG tablet Take 12.5 mg by mouth 2 (two) times daily as needed for dizziness.      . metFORMIN (GLUCOPHAGE-XR) 500 MG 24 hr tablet Take 1,000 mg by mouth daily.      . Multiple Vitamin (MULTIVITAMIN) capsule Take 1 capsule by mouth daily.      . pravastatin (PRAVACHOL) 80 MG  tablet Take 40 mg by mouth daily.        No current facility-administered medications for this visit.    Allergies  Allergen Reactions  . Codeine   . Crestor [Rosuvastatin]   . Lisinopril Cough    History   Social History  . Marital Status: Married    Spouse Name: N/A    Number of Children: 1  . Years of Education: N/A   Occupational History  . Not on file.   Social History Main Topics  . Smoking status: Former Smoker -- 0.00 packs/day for 30 years    Types: Cigarettes  . Smokeless tobacco: Former Systems developer    Quit date: 07/16/2012  . Alcohol Use: Not on file  . Drug Use: Not on file  . Sexual Activity: Not on file   Other Topics Concern  . Not on file   Social History Narrative  . No narrative on file     Review of Systems: General: negative for chills, fever, night sweats or weight changes.  Cardiovascular: negative for chest pain, dyspnea on exertion, edema, orthopnea, palpitations, paroxysmal nocturnal dyspnea or shortness of breath Dermatological: negative for rash Respiratory: negative for cough or wheezing Urologic: negative for hematuria Abdominal: negative for nausea, vomiting, diarrhea, bright red blood per rectum, melena, or hematemesis Neurologic: negative for visual changes, syncope, or dizziness All other systems reviewed and are otherwise negative except  as noted above.    Blood pressure 122/60, pulse 68, height 5\' 4"  (1.626 m), weight 151 lb 4.8 oz (68.629 kg).  General appearance: alert and no distress Neck: no adenopathy, no carotid bruit, no JVD, supple, symmetrical, trachea midline and thyroid not enlarged, symmetric, no tenderness/mass/nodules Lungs: clear to auscultation bilaterally Heart: regular rate and rhythm, S1, S2 normal, no murmur, click, rub or gallop Extremities: extremities normal, atraumatic, no cyanosis or edema  EKG normal sinus rhythm at 68 without ST or T wave changes  ASSESSMENT AND PLAN:   Essential  hypertension Controlled on current medications  Hyperlipidemia On statin therapy followed by her PCP  Peripheral arterial disease Status post distal abdominal aortic stenting by myself 12/19/06 with a megadose of expanding stent because of a distal abdominal aortic dissection with ultimate improvement in her symptoms and Dopplers. She currently denies claudication      Lorretta Harp MD [facility], [facility] 07/16/2013 12:14 PM

## 2013-07-16 NOTE — Patient Instructions (Signed)
Your physician wants you to follow-up in: 1 year with Dr Berry. You will receive a reminder letter in the mail two months in advance. If you don't receive a letter, please call our office to schedule the follow-up appointment.  

## 2013-07-30 ENCOUNTER — Other Ambulatory Visit: Payer: Self-pay | Admitting: *Deleted

## 2013-07-30 DIAGNOSIS — I10 Essential (primary) hypertension: Secondary | ICD-10-CM | POA: Diagnosis not present

## 2013-07-30 DIAGNOSIS — Z6825 Body mass index (BMI) 25.0-25.9, adult: Secondary | ICD-10-CM | POA: Diagnosis not present

## 2013-07-30 DIAGNOSIS — Z79899 Other long term (current) drug therapy: Secondary | ICD-10-CM | POA: Diagnosis not present

## 2013-07-30 DIAGNOSIS — E782 Mixed hyperlipidemia: Secondary | ICD-10-CM | POA: Diagnosis not present

## 2013-07-30 DIAGNOSIS — E119 Type 2 diabetes mellitus without complications: Secondary | ICD-10-CM | POA: Diagnosis not present

## 2013-07-30 MED ORDER — CLOPIDOGREL BISULFATE 75 MG PO TABS: 75.0000 mg | ORAL_TABLET | Freq: Every day | ORAL | Status: DC

## 2013-08-06 ENCOUNTER — Telehealth: Payer: Self-pay | Admitting: Cardiovascular Disease

## 2013-08-06 MED ORDER — CLOPIDOGREL BISULFATE 75 MG PO TABS: 75.0000 mg | ORAL_TABLET | Freq: Every day | ORAL | Status: DC

## 2013-08-06 NOTE — Telephone Encounter (Signed)
plavix refill sent electronically to [place], [place].

## 2013-08-06 NOTE — Telephone Encounter (Signed)
Refill on her Plavix (generic brand) was sent to wrong [facility]  _  Should be [address]  [phone]  Phone [phone]

## 2013-08-11 DIAGNOSIS — D485 Neoplasm of uncertain behavior of skin: Secondary | ICD-10-CM | POA: Diagnosis not present

## 2013-11-04 DIAGNOSIS — D2310 Other benign neoplasm of skin of unspecified eyelid, including canthus: Secondary | ICD-10-CM | POA: Diagnosis not present

## 2013-11-10 DIAGNOSIS — D2310 Other benign neoplasm of skin of unspecified eyelid, including canthus: Secondary | ICD-10-CM | POA: Diagnosis not present

## 2014-01-09 DIAGNOSIS — Z23 Encounter for immunization: Secondary | ICD-10-CM | POA: Diagnosis not present

## 2014-02-09 DIAGNOSIS — E782 Mixed hyperlipidemia: Secondary | ICD-10-CM | POA: Diagnosis not present

## 2014-02-09 DIAGNOSIS — E119 Type 2 diabetes mellitus without complications: Secondary | ICD-10-CM | POA: Diagnosis not present

## 2014-02-09 DIAGNOSIS — I10 Essential (primary) hypertension: Secondary | ICD-10-CM | POA: Diagnosis not present

## 2014-03-17 DIAGNOSIS — D485 Neoplasm of uncertain behavior of skin: Secondary | ICD-10-CM | POA: Diagnosis not present

## 2014-08-17 DIAGNOSIS — Z1389 Encounter for screening for other disorder: Secondary | ICD-10-CM | POA: Diagnosis not present

## 2014-08-17 DIAGNOSIS — Z1231 Encounter for screening mammogram for malignant neoplasm of breast: Secondary | ICD-10-CM | POA: Diagnosis not present

## 2014-08-17 DIAGNOSIS — R0602 Shortness of breath: Secondary | ICD-10-CM | POA: Diagnosis not present

## 2014-08-17 DIAGNOSIS — I10 Essential (primary) hypertension: Secondary | ICD-10-CM | POA: Diagnosis not present

## 2014-08-17 DIAGNOSIS — E119 Type 2 diabetes mellitus without complications: Secondary | ICD-10-CM | POA: Diagnosis not present

## 2014-08-17 DIAGNOSIS — Z79899 Other long term (current) drug therapy: Secondary | ICD-10-CM | POA: Diagnosis not present

## 2014-08-17 DIAGNOSIS — Z6824 Body mass index (BMI) 24.0-24.9, adult: Secondary | ICD-10-CM | POA: Diagnosis not present

## 2014-08-17 DIAGNOSIS — E782 Mixed hyperlipidemia: Secondary | ICD-10-CM | POA: Diagnosis not present

## 2014-08-17 DIAGNOSIS — S30861A Insect bite (nonvenomous) of abdominal wall, initial encounter: Secondary | ICD-10-CM | POA: Diagnosis not present

## 2014-09-12 ENCOUNTER — Other Ambulatory Visit: Payer: Self-pay | Admitting: Cardiovascular Disease

## 2014-09-15 NOTE — Telephone Encounter (Signed)
Rx(s) sent to pharmacy electronically.  

## 2014-09-22 DIAGNOSIS — R922 Inconclusive mammogram: Secondary | ICD-10-CM | POA: Diagnosis not present

## 2014-09-22 DIAGNOSIS — Z1231 Encounter for screening mammogram for malignant neoplasm of breast: Secondary | ICD-10-CM | POA: Diagnosis not present

## 2014-09-22 DIAGNOSIS — M8588 Other specified disorders of bone density and structure, other site: Secondary | ICD-10-CM | POA: Diagnosis not present

## 2014-09-22 DIAGNOSIS — Z13820 Encounter for screening for osteoporosis: Secondary | ICD-10-CM | POA: Diagnosis not present

## 2014-10-07 ENCOUNTER — Ambulatory Visit: Payer: No Typology Code available for payment source | Admitting: Cardiovascular Disease

## 2014-10-22 ENCOUNTER — Ambulatory Visit: Payer: No Typology Code available for payment source | Admitting: Cardiovascular Disease

## 2014-11-02 DIAGNOSIS — E119 Type 2 diabetes mellitus without complications: Secondary | ICD-10-CM | POA: Diagnosis not present

## 2014-12-04 ENCOUNTER — Ambulatory Visit (INDEPENDENT_AMBULATORY_CARE_PROVIDER_SITE_OTHER): Payer: Medicare Other | Admitting: Cardiovascular Disease

## 2014-12-04 ENCOUNTER — Encounter: Payer: Self-pay | Admitting: Cardiovascular Disease

## 2014-12-04 VITALS — BP 124/68 | HR 83 | Ht 64.0 in | Wt 145.4 lb

## 2014-12-04 DIAGNOSIS — I10 Essential (primary) hypertension: Secondary | ICD-10-CM

## 2014-12-04 DIAGNOSIS — E785 Hyperlipidemia, unspecified: Secondary | ICD-10-CM

## 2014-12-04 DIAGNOSIS — I739 Peripheral vascular disease, unspecified: Secondary | ICD-10-CM

## 2014-12-04 NOTE — Patient Instructions (Signed)
Medication Instructions:  Your physician recommends that you continue on your current medications as directed. Please refer to the Current Medication list given to you today.  Labwork: I will get your lab work from your Primary Care Physician.   Testing/Procedures: none  Follow-Up: Your physician wants you to follow-up in: 12 months with Dr. Berry. You will receive a reminder letter in the mail two months in advance. If you don't receive a letter, please call our office to schedule the follow-up appointment.   Any Other Special Instructions Will Be Listed Below (If Applicable).   

## 2014-12-04 NOTE — Assessment & Plan Note (Signed)
History of peripheral arterial disease status post percutaneous stenting of a distal abdominal aortic flap with a 4 cm x 14 mm Smart nitinol self expanding stent. She clinically improved after this as did her Doppler studies. She currently denies claudication.

## 2014-12-04 NOTE — Assessment & Plan Note (Addendum)
History of hyperlipidemia on pravastatin and fenofibrate followed by her PCP

## 2014-12-04 NOTE — Assessment & Plan Note (Signed)
History of hypertension blood pressure measured at 122/60. She is on losartan. Continue current meds at current dosing

## 2014-12-04 NOTE — Progress Notes (Signed)
12/04/2014 [name] [name]   Apr 16, 1945  [id number]  Primary Physician Leonides Sake, MD Primary Cardiologist: Lorretta Harp MD Renae Gloss   HPI:  The patient is a 69 year old, thin-appearing, widowed Caucasian female, mother of 1 child, grandmother to 2 grandchildren who I saw 07/16/13.[name] She has a history of discontinued tobacco abuse, having stopped on September 10, 2009. She did have 45-pack-year history prior to that. She has hypertension and hyperlipidemia as well. Because of claudication, I angiogram'd her on November 15, 2006, revealing a distal abdominal aortic flap which I ultimately stented 1 month later with a 4-x-14 SMART Nitinol self-expanding stent. Followup Dopplers were normal and her claudication resolved. Dr. Lisbeth Ply follows her lipid profile. She does get short of breath probably related to COPD. She had a Myoview stress test performed in 2008 which was normal. Dopplers performed May 24, 2011, revealed ABIs of 1 bilaterally with normal abdominal aortic waveform. Since I saw her 1-1/2 years  ago she denies chest pain, shortness of breath or claudication.   Current Outpatient Prescriptions  Medication Sig Dispense Refill  . alendronate (FOSAMAX) 70 MG tablet Take 70 mg by mouth once a week. Take with a full glass of water on an empty stomach.    [name] aspirin 325 MG tablet Take 325 mg by mouth daily.    . clopidogrel (PLAVIX) 75 MG tablet TAKE ONE TABLET BY MOUTH ONCE DAILY WITH BREAKFAST 90 tablet 2  . losartan (COZAAR) 25 MG tablet Take 25 mg by mouth daily.    . meclizine (ANTIVERT) 12.5 MG tablet Take 12.5 mg by mouth 2 (two) times daily as needed for dizziness.    . metFORMIN (GLUCOPHAGE-XR) 500 MG 24 hr tablet Take 1,000 mg by mouth daily.    . Multiple Vitamin (MULTIVITAMIN) capsule Take 1 capsule by mouth daily.    . pravastatin (PRAVACHOL) 80 MG tablet Take 40 mg by mouth daily.      No current facility-administered medications for this visit.     Allergies  Allergen Reactions  . Codeine Photosensitivity  . Crestor [Rosuvastatin] Other (See Comments)    Muscle pain  . Lisinopril Cough    Social History   Social History  . Marital Status: Married    Spouse Name: N/A  . Number of Children: 1  . Years of Education: N/A   Occupational History  . Not on file.   Social History Main Topics  . Smoking status: Former Smoker -- 0.00 packs/day for 30 years    Types: Cigarettes  . Smokeless tobacco: Former Systems developer    Quit date: 07/16/2012  . Alcohol Use: Not on file  . Drug Use: Not on file  . Sexual Activity: Not on file   Other Topics Concern  . Not on file   Social History Narrative     Review of Systems: General: negative for chills, fever, night sweats or weight changes.  Cardiovascular: negative for chest pain, dyspnea on exertion, edema, orthopnea, palpitations, paroxysmal nocturnal dyspnea or shortness of breath Dermatological: negative for rash Respiratory: negative for cough or wheezing Urologic: negative for hematuria Abdominal: negative for nausea, vomiting, diarrhea, bright red blood per rectum, melena, or hematemesis Neurologic: negative for visual changes, syncope, or dizziness All other systems reviewed and are otherwise negative except as noted above.    Blood pressure 124/68, pulse 83, height 5\' 4"  (1.626 m), weight 145 lb 6.4 oz (65.953 kg).  General appearance: alert and no distress Neck: no adenopathy, no carotid bruit, no  JVD, supple, symmetrical, trachea midline and thyroid not enlarged, symmetric, no tenderness/mass/nodules Lungs: clear to auscultation bilaterally Heart: regular rate and rhythm, S1, S2 normal, no murmur, click, rub or gallop Extremities: extremities normal, atraumatic, no cyanosis or edema  EKG normal sinus rhythm at 83 without ST or T-wave changes. I personally reviewed this EKG  ASSESSMENT AND PLAN:   Peripheral arterial disease History of peripheral arterial disease  status post percutaneous stenting of a distal abdominal aortic flap with a 4 cm x 14 mm Smart nitinol self expanding stent. She clinically improved after this as did her Doppler studies. She currently denies claudication.  Hyperlipidemia History of hyperlipidemia on pravastatin and fenofibrate followed by her PCP  Essential hypertension History of hypertension blood pressure measured at 122/60. She is on losartan. Continue current meds at current dosing      Lorretta Harp MD [facility], [facility] 12/04/2014 12:16 PM

## 2014-12-08 ENCOUNTER — Encounter: Payer: Self-pay | Admitting: Cardiovascular Disease

## 2015-02-15 DIAGNOSIS — E782 Mixed hyperlipidemia: Secondary | ICD-10-CM | POA: Diagnosis not present

## 2015-02-15 DIAGNOSIS — I10 Essential (primary) hypertension: Secondary | ICD-10-CM | POA: Diagnosis not present

## 2015-02-15 DIAGNOSIS — L989 Disorder of the skin and subcutaneous tissue, unspecified: Secondary | ICD-10-CM | POA: Diagnosis not present

## 2015-02-15 DIAGNOSIS — E119 Type 2 diabetes mellitus without complications: Secondary | ICD-10-CM | POA: Diagnosis not present

## 2015-02-15 DIAGNOSIS — Z9181 History of falling: Secondary | ICD-10-CM | POA: Diagnosis not present

## 2015-02-15 DIAGNOSIS — Z23 Encounter for immunization: Secondary | ICD-10-CM | POA: Diagnosis not present

## 2015-02-15 DIAGNOSIS — Z87891 Personal history of nicotine dependence: Secondary | ICD-10-CM | POA: Diagnosis not present

## 2015-02-15 DIAGNOSIS — Z1389 Encounter for screening for other disorder: Secondary | ICD-10-CM | POA: Diagnosis not present

## 2015-04-01 ENCOUNTER — Other Ambulatory Visit: Payer: Self-pay | Admitting: Family Medicine

## 2015-04-01 ENCOUNTER — Encounter: Payer: Self-pay | Admitting: Family Medicine

## 2015-04-01 DIAGNOSIS — Z87891 Personal history of nicotine dependence: Secondary | ICD-10-CM

## 2015-04-01 HISTORY — DX: Personal history of nicotine dependence: Z87.891

## 2015-04-02 ENCOUNTER — Ambulatory Visit
Admission: RE | Admit: 2015-04-02 | Discharge: 2015-04-02 | Disposition: A | Discharge status: Home / Self Care | Payer: Medicare Other | Source: Ambulatory Visit | Attending: Family Medicine | Admitting: Family Medicine

## 2015-04-02 ENCOUNTER — Encounter: Payer: Self-pay | Admitting: Family Medicine

## 2015-04-02 ENCOUNTER — Inpatient Hospital Stay: Payer: Medicare Other | Attending: Family Medicine | Admitting: Family Medicine

## 2015-04-02 DIAGNOSIS — Z79891 Long term (current) use of opiate analgesic: Secondary | ICD-10-CM | POA: Diagnosis not present

## 2015-04-02 DIAGNOSIS — J479 Bronchiectasis, uncomplicated: Secondary | ICD-10-CM | POA: Diagnosis not present

## 2015-04-02 DIAGNOSIS — Z87891 Personal history of nicotine dependence: Secondary | ICD-10-CM | POA: Diagnosis not present

## 2015-04-02 DIAGNOSIS — I2510 Atherosclerotic heart disease of native coronary artery without angina pectoris: Secondary | ICD-10-CM | POA: Insufficient documentation

## 2015-04-02 DIAGNOSIS — Z122 Encounter for screening for malignant neoplasm of respiratory organs: Secondary | ICD-10-CM

## 2015-04-02 DIAGNOSIS — J439 Emphysema, unspecified: Secondary | ICD-10-CM | POA: Insufficient documentation

## 2015-04-02 NOTE — Progress Notes (Signed)
In accordance with CMS guidelines, patient has meet eligibility criteria including age, absence of signs or symptoms of lung cancer, the specific calculation of cigarette smoking pack-years was 45 years and is a former smoker, having quit in 2010.   A shared decision-making session was conducted prior to the performance of CT scan. This includes one or more decision aids, includes benefits and harms of screening, follow-up diagnostic testing, over-diagnosis, false positive rate, and total radiation exposure.  Counseling on the importance of adherence to annual lung cancer LDCT screening, impact of co-morbidities, and ability or willingness to undergo diagnosis and treatment is imperative for compliance of the program.  Counseling on the importance of continued smoking cessation for former smokers; the importance of smoking cessation for current smokers and information about tobacco cessation interventions have been given to patient including the [place] at [facility], 1800 quit [place], as well as [place] specific smoking cessation programs.  Written order for lung cancer screening with LDCT has been given to the patient and any and all questions have been answered to the best of my abilities.   Yearly follow up will be scheduled by Burgess Estelle, Thoracic Navigator.

## 2015-04-05 ENCOUNTER — Telehealth: Payer: Self-pay | Admitting: *Deleted

## 2015-04-05 NOTE — Telephone Encounter (Signed)
Contacted Dr. Lisbeth Ply to review lung cancer screening scan with the below results including review of non lung cancer related findings. Dr. Lisbeth Ply is agreeable to review of imaging by thoracic disease team and proceeding with recommendations. Attempt to notify patient was made and voicemail was left for patient to call me back.    IMPRESSION: 1. Lung-RADS Category 4AS, suspicious. Follow up low-dose chest CT without contrast in 3 months (please use the following order, "CT CHEST LCS NODULE FOLLOW-UP W/O CM") is recommended. Alternatively, PET may be considered when there is a solid component 86mm or larger. 2. The "S" modifier above refers to potentially clinically significant non lung cancer related findings. Specifically, Atherosclerosis, including left main and 3 vessel coronary artery disease. Please note that although the presence of coronary artery calcium documents the presence of coronary artery disease, the severity of this disease and any potential stenosis cannot be assessed on this non-gated CT examination. Assessment for potential risk factor modification, dietary therapy or pharmacologic therapy may be warranted, if clinically indicated.

## 2015-04-06 ENCOUNTER — Telehealth: Payer: Self-pay | Admitting: *Deleted

## 2015-04-06 NOTE — Telephone Encounter (Signed)
Contacted patient and reviewed information from ct screening scan noted yesterday including incidental findings and planned 3 month follow up imaging. Patient verbalizes understanding.

## 2015-04-23 ENCOUNTER — Telehealth: Payer: Self-pay | Admitting: *Deleted

## 2015-04-23 NOTE — Telephone Encounter (Signed)
Screening scan discussed in conference and recommendation made for patient to see thoracic surgeon for decision making regarding follow up imaging with CT in 3 months vs PET scan. Patient notified and given appointment for Thursday 04/29/15 at [facility].

## 2015-04-29 ENCOUNTER — Inpatient Hospital Stay: Payer: Medicare Other | Attending: Cardiothoracic Surgery | Admitting: Cardiothoracic Surgery

## 2015-04-29 ENCOUNTER — Encounter: Payer: Self-pay | Admitting: Cardiothoracic Surgery

## 2015-04-29 ENCOUNTER — Encounter: Payer: Self-pay | Admitting: *Deleted

## 2015-04-29 VITALS — BP 170/77 | HR 74 | Temp 97.2°F | Ht 64.0 in | Wt 146.3 lb

## 2015-04-29 DIAGNOSIS — R911 Solitary pulmonary nodule: Secondary | ICD-10-CM | POA: Diagnosis not present

## 2015-04-29 NOTE — Progress Notes (Signed)
Patient ID: [name] [name], female   DOB: 01-13-1946, 70 y.o.   MRN: [id number]  Chief Complaint  Patient presents with  . Lung Lesion    consult    Referred By Dr. Lisbeth Ply Reason for Referral right lower lobe mass  HPI Location, Quality, Duration, Severity, Timing, Context, Modifying Factors, Associated Signs and Symptoms.  [name] [name] is a 70 y.o. female.  This patient is a 69 year old white female who has a history of smoking in the past. She smoked a pack a day for over 40 years but quit in 2010. Dr. Lisbeth Ply obtained a CT scan as part of our low-dose screening studies. This revealed a 9 mm right lower lobe rounded nodule. 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 The patient did not have any pulmonary symptoms whatsoever although she does occasionally get short of breath with exertion. He denied any history of fevers chills or night sweats. She denied any weight loss. She denied hemoptysis. She comes in today to discuss the workup and evaluation of this nodule. She does have a family history of breast cancer. Her father died of COPD but no member of her family has had lung cancer.   Past Medical History  Diagnosis Date  . Hypertension   . Hyperlipidemia   . COPD (chronic obstructive pulmonary disease) ([place])   . PVD (peripheral vascular disease) ([place])     abdominal aorta stent  . Personal history of tobacco use, presenting hazards to health 04/01/2015  . Emphysema of lung [facility])     Past Surgical History  Procedure Laterality Date  . Nm myocar perf wall motion  10/2006    persantine - EF 77%, breast attenuation, low risk, normal perfusion  . Abdominal aorta stent  12/19/2006    PTA & stent with 4x14 nitinol self-expanding stent with IBIS guidance - diagnostic cath 11/15/2006 (Dr. Adora Fridge)    Family History  Problem Relation Age of Onset  . Stroke Mother   . Lung disease Father   . Heart attack Brother     in 30s  . Heart disease Brother     Social  History Social History  Substance Use Topics  . Smoking status: Former Smoker -- 1.00 packs/day for 45 years    Types: Cigarettes    Quit date: 03/13/2008  . Smokeless tobacco: Former Systems developer    Quit date: 07/16/2012  . Alcohol Use: None    Allergies  Allergen Reactions  . Codeine Photosensitivity  . Crestor [Rosuvastatin] Other (See Comments)    Muscle pain  . Lisinopril Cough    Current Outpatient Prescriptions  Medication Sig Dispense Refill  . alendronate (FOSAMAX) 70 MG tablet Take 70 mg by mouth once a week. Take with a full glass of water on an empty stomach.    [name] aspirin 325 MG tablet Take 325 mg by mouth daily.    . clopidogrel (PLAVIX) 75 MG tablet TAKE ONE TABLET BY MOUTH ONCE DAILY WITH BREAKFAST 90 tablet 2  . losartan (COZAAR) 25 MG tablet Take 25 mg by mouth daily.    . meclizine (ANTIVERT) 12.5 MG tablet Take 12.5 mg by mouth 2 (two) times daily as needed for dizziness.    . metFORMIN (GLUCOPHAGE-XR) 500 MG 24 hr tablet Take 1,000 mg by mouth daily.    . Multiple Vitamin (MULTIVITAMIN) capsule Take 1 capsule by mouth daily.    . pravastatin (PRAVACHOL) 80 MG tablet Take 40 mg by mouth daily.  No current facility-administered medications for this visit.      Review of Systems A complete review of systems was asked and was negative except for the following positive findings frequent cough, frequent urination, hayfever  Blood pressure 170/77, pulse 74, temperature 97.2 F (36.2 C), temperature source Tympanic, height 5\' 4"  (1.626 m), weight 146 lb 4.4 oz (66.35 kg), SpO2 97 %.  Physical Exam CONSTITUTIONAL:  Pleasant, well-developed, well-nourished, and in no acute distress. EYES: Pupils equal and reactive to light, Sclera non-icteric EARS, NOSE, MOUTH AND THROAT:  The oropharynx was clear.  Dentition is good repair.  Oral mucosa pink and moist. LYMPH NODES:  Lymph nodes in the neck and axillae were normal RESPIRATORY:  Lungs were clear.  Normal respiratory  effort without pathologic use of accessory muscles of respiration CARDIOVASCULAR: Heart was regular without murmurs.  There were no carotid bruits. GI: The abdomen was soft, nontender, and nondistended. There were no palpable masses. There was no hepatosplenomegaly. There were normal bowel sounds in all quadrants. GU:  Rectal deferred.   MUSCULOSKELETAL:  Normal muscle strength and tone.  No clubbing or cyanosis.   SKIN:  There were no pathologic skin lesions.  There were no nodules on palpation. NEUROLOGIC:  Sensation is normal.  Cranial nerves are grossly intact. PSYCH:  Oriented to person, place and time.  Mood and affect are normal.  Data Reviewed CT scan  I have personally reviewed the patient's imaging, laboratory findings and medical records.    Assessment    I have independently reviewed the patient's CT scan. There is a 9 mm right lower lobe pulmonary nodule that is rounded and consistent with either hamartoma or carcinoid as the most likely possibilities. The patient does tell me that she's had mammograms in the past and at the same time as had some imaging study which she believes might have been a chest CT. I have looked at her prior imaging from 2008 which included multiple CT scans of the abdomen for her vascular disease. These do not include any of the lung windows high enough to evaluate the current pulmonary nodule.  I had a long discussion with her regarding the options. These would include percutaneous biopsy, PET scan and serial CT scans. At the present time the patient like pursue a PET scan.    Plan    I have asked the patient to go to [facility] imaging and obtain her prior films. Perhaps there is a chest CT amongst this. In addition we'll get a PET scan and I'll see her back in one week.       Nestor Lewandowsky, MD 04/29/2015, 9:05 AM

## 2015-04-29 NOTE — Progress Notes (Signed)
Met with patient at thoracic surgery appointment. Introduced Programmer, multimedia and reviewed plan of care. Will follow with PET scan this week and to see Dr. Genevive Bi next week.

## 2015-04-30 ENCOUNTER — Other Ambulatory Visit: Payer: Self-pay | Admitting: Cardiothoracic Surgery

## 2015-04-30 ENCOUNTER — Inpatient Hospital Stay
Admission: RE | Admit: 2015-04-30 | Discharge: 2015-04-30 | Disposition: A | Discharge status: Home / Self Care | Payer: Self-pay | Source: Ambulatory Visit | Attending: Cardiothoracic Surgery | Admitting: Cardiothoracic Surgery

## 2015-04-30 ENCOUNTER — Other Ambulatory Visit: Payer: Self-pay | Admitting: *Deleted

## 2015-04-30 DIAGNOSIS — R911 Solitary pulmonary nodule: Secondary | ICD-10-CM

## 2015-04-30 DIAGNOSIS — R918 Other nonspecific abnormal finding of lung field: Secondary | ICD-10-CM

## 2015-05-11 ENCOUNTER — Ambulatory Visit
Admission: RE | Admit: 2015-05-11 | Discharge: 2015-05-11 | Disposition: A | Discharge status: Home / Self Care | Payer: Medicare Other | Source: Ambulatory Visit | Attending: Cardiothoracic Surgery | Admitting: Cardiothoracic Surgery

## 2015-05-11 ENCOUNTER — Ambulatory Visit: Payer: Medicare Other

## 2015-05-11 DIAGNOSIS — R918 Other nonspecific abnormal finding of lung field: Secondary | ICD-10-CM

## 2015-05-11 DIAGNOSIS — R911 Solitary pulmonary nodule: Secondary | ICD-10-CM | POA: Insufficient documentation

## 2015-05-11 MED ORDER — ALBUTEROL SULFATE (2.5 MG/3ML) 0.083% IN NEBU
2.5000 mg | INHALATION_SOLUTION | Freq: Once | RESPIRATORY_TRACT | Status: AC
  Administered 2015-05-11: 2.5 mg via RESPIRATORY_TRACT
  Filled 2015-05-11: qty 3

## 2015-05-12 ENCOUNTER — Encounter
Admission: RE | Admit: 2015-05-12 | Discharge: 2015-05-12 | Disposition: A | Discharge status: Home / Self Care | Payer: Medicare Other | Source: Ambulatory Visit | Attending: Cardiothoracic Surgery | Admitting: Cardiothoracic Surgery

## 2015-05-12 DIAGNOSIS — R911 Solitary pulmonary nodule: Secondary | ICD-10-CM | POA: Insufficient documentation

## 2015-05-12 LAB — GLUCOSE, CAPILLARY: GLUCOSE-CAPILLARY: 110 mg/dL — AB (ref 65–99)

## 2015-05-12 MED ORDER — FLUDEOXYGLUCOSE F - 18 (FDG) INJECTION: 12.6800 | Freq: Once | INTRAVENOUS | Status: DC | PRN

## 2015-05-13 ENCOUNTER — Inpatient Hospital Stay: Payer: Medicare Other | Attending: Cardiothoracic Surgery | Admitting: Cardiothoracic Surgery

## 2015-05-13 VITALS — BP 170/86 | HR 66 | Temp 98.0°F | Resp 18 | Wt 145.1 lb

## 2015-05-13 DIAGNOSIS — R911 Solitary pulmonary nodule: Secondary | ICD-10-CM | POA: Diagnosis not present

## 2015-05-13 DIAGNOSIS — R918 Other nonspecific abnormal finding of lung field: Secondary | ICD-10-CM | POA: Diagnosis not present

## 2015-05-13 NOTE — Progress Notes (Signed)
Here to discuss PET scan results and does not offer any concerns at this time.

## 2015-05-13 NOTE — Progress Notes (Signed)
[facility] Post-Op Note  Patient ID: [name] [name], female   DOB: 1945-08-07, 70 y.o.   MRN: [id number]  HISTORY: She returns today in follow-up. She states she feels well. She does complain of shortness of breath with exertion.   Filed Vitals:   05/13/15 1339  BP: 170/86  Pulse: 66  Temp: 98 F (36.7 C)  Resp: 18     EXAM: Resp: Lungs are clear bilaterally.  No respiratory distress, normal effort. Heart:  Regular without murmurs Abd:  Abdomen is soft, non distended and non tender. No masses are palpable.  There is no rebound and no guarding.  Neurological: Alert and oriented to person, place, and time. Coordination normal.  Skin: Skin is warm and dry. No rash noted. No diaphoretic. No erythema. No pallor.  Psychiatric: Normal mood and affect. Normal behavior. Judgment and thought content normal.    ASSESSMENT: I have independently reviewed the PET scan. This did not show any uptake in the lung. The CT scan does show a slight increase in size over the last 5 years of the lung nodule.   PLAN:   I had a long discussion today with the patient regarding her pulmonary function studies. Her pulmonary functions reveal an FEV1 and a DLCO that are between 55 and 60%. In addition she is somewhat limited by her shortness of breath with exertion. I explained to her that this lesion is most likely benign and that options would include a thoracotomy for enucleation of the tumor but that a lobectomy would carry significant functional deficits. 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 I explained her that the standard to care was an anatomic resection. She understood. She would like to bring her son in next week to further review the options. We'll go ahead and set her up for that.    Nestor Lewandowsky, MD

## 2015-05-14 ENCOUNTER — Encounter: Payer: Self-pay | Admitting: *Deleted

## 2015-05-14 NOTE — Progress Notes (Signed)
Met with patient at surgery appointment. reviewed plan of care. Will follow.

## 2015-05-20 ENCOUNTER — Inpatient Hospital Stay (HOSPITAL_BASED_OUTPATIENT_CLINIC_OR_DEPARTMENT_OTHER): Payer: Medicare Other | Admitting: Cardiothoracic Surgery

## 2015-05-20 ENCOUNTER — Encounter: Payer: Self-pay | Admitting: Cardiothoracic Surgery

## 2015-05-20 VITALS — BP 153/76 | HR 69 | Temp 97.8°F | Wt 166.2 lb

## 2015-05-20 DIAGNOSIS — R911 Solitary pulmonary nodule: Secondary | ICD-10-CM | POA: Diagnosis not present

## 2015-05-20 NOTE — Progress Notes (Signed)
[facility] Post-[facility] Note  Patient ID: MIKAEL DRYER, female   DOB: January 31, 1946, 70 y.o.   MRN: [id number]  HISTORY: This patient returns today with her family to discuss the management of her right lung nodule. She is a 70 year old female who had a chest CT made back in 2012 showing a small nodule in the right upper or right lower lobe immediately behind the right upper lobe bronchus. The lesion was not commented upon at that time but was only seen in retrospect. Over the course the last several years this nodule is increased in size and it currently measures about 8 mm. She also had a PET scan done which did not reveal any significant areas of uptake. Patient was unsure as to how she would like proceed and wished for me to meet with her family today to discuss the options.   Filed Vitals:   05/20/15 1439  BP: 153/76  Pulse: 69  Temp: 97.8 F (36.6 C)     EXAM: Resp: Lungs are clear bilaterally.  No respiratory distress, normal effort. Heart:  Regular without murmurs Abd:  Abdomen is soft, non distended and non tender. No masses are palpable.  There is no rebound and no guarding.  Neurological: Alert and oriented to person, place, and time. Coordination normal.  Skin: Skin is warm and dry. No rash noted. No diaphoretic. No erythema. No pallor.  Psychiatric: Normal mood and affect. Normal behavior. Judgment and thought content normal.    ASSESSMENT: I had a long discussion with her and her family today. She was accompanied today by her son and daughter-in-law. I reviewed with them the indications and risks for the various options including continued surveillance or surgical resection. After an extensive discussion a would like to pursue continued surveillance and we've made arrangements for her to come back in 6 months time with a repeat CT scan. They are aware of the implications of their decision-making including the fact that this may represent a slow-growing malignancy.   PLAN:    We will see her back in 6 months with a CT scan the chest.    Nestor Lewandowsky, MD

## 2015-06-21 DIAGNOSIS — L989 Disorder of the skin and subcutaneous tissue, unspecified: Secondary | ICD-10-CM | POA: Diagnosis not present

## 2015-06-21 DIAGNOSIS — E042 Nontoxic multinodular goiter: Secondary | ICD-10-CM | POA: Diagnosis not present

## 2015-06-21 DIAGNOSIS — E782 Mixed hyperlipidemia: Secondary | ICD-10-CM | POA: Diagnosis not present

## 2015-06-21 DIAGNOSIS — J439 Emphysema, unspecified: Secondary | ICD-10-CM | POA: Diagnosis not present

## 2015-06-21 DIAGNOSIS — E119 Type 2 diabetes mellitus without complications: Secondary | ICD-10-CM | POA: Diagnosis not present

## 2015-06-21 DIAGNOSIS — I10 Essential (primary) hypertension: Secondary | ICD-10-CM | POA: Diagnosis not present

## 2015-06-21 DIAGNOSIS — Z6824 Body mass index (BMI) 24.0-24.9, adult: Secondary | ICD-10-CM | POA: Diagnosis not present

## 2015-06-22 DIAGNOSIS — D485 Neoplasm of uncertain behavior of skin: Secondary | ICD-10-CM | POA: Diagnosis not present

## 2015-06-22 DIAGNOSIS — C44319 Basal cell carcinoma of skin of other parts of face: Secondary | ICD-10-CM | POA: Diagnosis not present

## 2015-06-29 ENCOUNTER — Other Ambulatory Visit: Payer: Self-pay | Admitting: Cardiovascular Disease

## 2015-06-30 NOTE — Telephone Encounter (Signed)
Rx request sent to pharmacy.  

## 2015-07-26 DIAGNOSIS — C44319 Basal cell carcinoma of skin of other parts of face: Secondary | ICD-10-CM | POA: Diagnosis not present

## 2015-07-26 DIAGNOSIS — L578 Other skin changes due to chronic exposure to nonionizing radiation: Secondary | ICD-10-CM | POA: Diagnosis not present

## 2015-10-25 DIAGNOSIS — Z79899 Other long term (current) drug therapy: Secondary | ICD-10-CM | POA: Diagnosis not present

## 2015-10-25 DIAGNOSIS — J439 Emphysema, unspecified: Secondary | ICD-10-CM | POA: Diagnosis not present

## 2015-10-25 DIAGNOSIS — I10 Essential (primary) hypertension: Secondary | ICD-10-CM | POA: Diagnosis not present

## 2015-10-25 DIAGNOSIS — E119 Type 2 diabetes mellitus without complications: Secondary | ICD-10-CM | POA: Diagnosis not present

## 2015-10-25 DIAGNOSIS — M545 Low back pain: Secondary | ICD-10-CM | POA: Diagnosis not present

## 2015-10-25 DIAGNOSIS — E782 Mixed hyperlipidemia: Secondary | ICD-10-CM | POA: Diagnosis not present

## 2015-11-08 DIAGNOSIS — L821 Other seborrheic keratosis: Secondary | ICD-10-CM | POA: Diagnosis not present

## 2015-11-08 DIAGNOSIS — L812 Freckles: Secondary | ICD-10-CM | POA: Diagnosis not present

## 2015-11-08 DIAGNOSIS — L578 Other skin changes due to chronic exposure to nonionizing radiation: Secondary | ICD-10-CM | POA: Diagnosis not present

## 2015-11-08 DIAGNOSIS — L720 Epidermal cyst: Secondary | ICD-10-CM | POA: Diagnosis not present

## 2015-11-08 DIAGNOSIS — L820 Inflamed seborrheic keratosis: Secondary | ICD-10-CM | POA: Diagnosis not present

## 2015-11-08 DIAGNOSIS — L570 Actinic keratosis: Secondary | ICD-10-CM | POA: Diagnosis not present

## 2015-11-08 DIAGNOSIS — Z85828 Personal history of other malignant neoplasm of skin: Secondary | ICD-10-CM | POA: Diagnosis not present

## 2015-12-01 ENCOUNTER — Telehealth: Payer: Self-pay

## 2015-12-01 DIAGNOSIS — E119 Type 2 diabetes mellitus without complications: Secondary | ICD-10-CM | POA: Insufficient documentation

## 2015-12-01 DIAGNOSIS — R911 Solitary pulmonary nodule: Secondary | ICD-10-CM

## 2015-12-01 NOTE — Telephone Encounter (Signed)
Patient called and scheduled for 01/07/16 for 6 month follow-up with Dr. Genevive Bi. [place] so no earlier appointment could be made.  CT order placed.  CT Chest scheduled for 01/07/16 at 0900am at [facility].  Patient has been made aware of all above appointments.

## 2015-12-07 ENCOUNTER — Ambulatory Visit: Payer: No Typology Code available for payment source | Admitting: Cardiovascular Disease

## 2015-12-08 ENCOUNTER — Ambulatory Visit: Payer: No Typology Code available for payment source

## 2015-12-24 DIAGNOSIS — Z23 Encounter for immunization: Secondary | ICD-10-CM | POA: Diagnosis not present

## 2015-12-27 ENCOUNTER — Other Ambulatory Visit: Payer: Self-pay | Admitting: Cardiovascular Disease

## 2015-12-29 ENCOUNTER — Ambulatory Visit (INDEPENDENT_AMBULATORY_CARE_PROVIDER_SITE_OTHER): Payer: Medicare Other | Admitting: Cardiovascular Disease

## 2015-12-29 ENCOUNTER — Encounter: Payer: Self-pay | Admitting: Cardiovascular Disease

## 2015-12-29 VITALS — BP 124/68 | HR 83 | Ht 65.0 in | Wt 146.2 lb

## 2015-12-29 DIAGNOSIS — I739 Peripheral vascular disease, unspecified: Secondary | ICD-10-CM

## 2015-12-29 DIAGNOSIS — I10 Essential (primary) hypertension: Secondary | ICD-10-CM

## 2015-12-29 DIAGNOSIS — E7800 Pure hypercholesterolemia, unspecified: Secondary | ICD-10-CM

## 2015-12-29 NOTE — Assessment & Plan Note (Signed)
History of hyperlipidemia on statin therapy followed by her PCP. 

## 2015-12-29 NOTE — Patient Instructions (Signed)
Medication Instructions:  NO CHANGES.  Labwork: NONE  Testing/Procedures: Your physician has requested that you have a lower extremity arterial doppler- During this test, ultrasound is used to evaluate arterial blood flow in the legs. Allow approximately one hour for this exam.    Follow-Up: Your physician wants you to follow-up in: [place].  You will receive a reminder letter in the mail two months in advance. If you don't receive a letter, please call our office to schedule the follow-up appointment.   If you need a refill on your cardiac medications before your next appointment, please call your pharmacy.

## 2015-12-29 NOTE — Assessment & Plan Note (Signed)
History of peripheral arterial disease status post distal abdominal aortic flap which I stented September 2008 with a 14 mm x 4 cm Smart L self expanding stent. Her claudication resolved and her Dopplers normalized. She's had no recurrent symptoms. Her last Doppler study performed 09/11/12 were normal. We will repeat lower extremity arterial Doppler studies

## 2015-12-29 NOTE — Progress Notes (Signed)
12/29/2015 [name] [name]   [birth year]  [id number]  Primary Physician Leonides Sake, MD Primary Cardiologist: Lorretta Harp MD Renae Gloss  HPI:  The patient is a 70 year old, thin-appearing, widowed Caucasian female, mother of 1 child, grandmother to 2 grandchildren who I saw 12/04/14.[name] [name] She has a history of discontinued tobacco abuse, having stopped on September 10, 2009. She did have 45-pack-year history prior to that. She has hypertension and hyperlipidemia as well. Because of claudication, I angiogram'd her on November 15, 2006, revealing a distal abdominal aortic flap which I ultimately stented 1 month later with a 4-x-14 SMART Nitinol self-expanding stent. Followup Dopplers were normal and her claudication resolved. Dr. Lisbeth Ply follows her lipid profile. She does get short of breath probably related to COPD. She had a Myoview stress test performed in 2008 which was normal. Dopplers performed May 24, 2011, revealed ABIs of 1 bilaterally with normal abdominal aortic waveform. Since I saw last  she denies chest pain, shortness of breath or claudication.   Current Outpatient Prescriptions  Medication Sig Dispense Refill  . alendronate (FOSAMAX) 70 MG tablet Take 70 mg by mouth once a week. Take with a full glass of water on an empty stomach.    [name] [name] aspirin 325 MG tablet Take 325 mg by mouth daily.    . clopidogrel (PLAVIX) 75 MG tablet TAKE ONE TABLET BY MOUTH ONCE DAILY WITH BREAKFAST 90 tablet 1  . losartan (COZAAR) 25 MG tablet Take 25 mg by mouth daily.    . meclizine (ANTIVERT) 12.5 MG tablet Take 12.5 mg by mouth 2 (two) times daily as needed for dizziness.    . metFORMIN (GLUCOPHAGE-XR) 500 MG 24 hr tablet Take 500 mg by mouth daily.     . Multiple Vitamin (MULTIVITAMIN) capsule Take 1 capsule by mouth daily.    . pravastatin (PRAVACHOL) 80 MG tablet Take 80 mg by mouth daily.     [name] [name] SPIRIVA HANDIHALER 18 MCG inhalation capsule Place 1 puff into inhaler and inhale daily  as needed.     No current facility-administered medications for this visit.     Allergies  Allergen Reactions  . Codeine Photosensitivity  . Crestor [Rosuvastatin] Other (See Comments)    Muscle pain  . Lisinopril Cough    Social History   Social History  . Marital status: Married    Spouse name: N/A  . Number of children: 1  . Years of education: N/A   Occupational History  . Not on file.   Social History Main Topics  . Smoking status: Former Smoker    Packs/day: 1.00    Years: 45.00    Types: Cigarettes    Quit date: 03/13/2008  . Smokeless tobacco: Former Systems developer    Quit date: 07/16/2012  . Alcohol use Not on file  . Drug use: Unknown  . Sexual activity: Not on file   Other Topics Concern  . Not on file   Social History Narrative  . No narrative on file     Review of Systems: General: negative for chills, fever, night sweats or weight changes.  Cardiovascular: negative for chest pain, dyspnea on exertion, edema, orthopnea, palpitations, paroxysmal nocturnal dyspnea or shortness of breath Dermatological: negative for rash Respiratory: negative for cough or wheezing Urologic: negative for hematuria Abdominal: negative for nausea, vomiting, diarrhea, bright red blood per rectum, melena, or hematemesis Neurologic: negative for visual changes, syncope, or dizziness All other systems reviewed and are otherwise negative except as noted above.  Blood pressure 124/68, pulse 83, height 5\' 5"  (1.651 m), weight 146 lb 3.2 oz (66.3 kg).  General appearance: alert and no distress Neck: no adenopathy, no carotid bruit, no JVD, supple, symmetrical, trachea midline and thyroid not enlarged, symmetric, no tenderness/mass/nodules Lungs: clear to auscultation bilaterally Heart: regular rate and rhythm, S1, S2 normal, no murmur, click, rub or gallop Extremities: extremities normal, atraumatic, no cyanosis or edema  EKG sinus rhythm at 83 for ST or T-wave changes. I personally  reviewed this EKG.  ASSESSMENT AND PLAN:   Peripheral arterial disease History of peripheral arterial disease status post distal abdominal aortic flap which I stented September 2008 with a 14 mm x 4 cm Smart L self expanding stent. Her claudication resolved and her Dopplers normalized. She's had no recurrent symptoms. Her last Doppler study performed 09/11/12 were normal. We will repeat lower extremity arterial Doppler studies  Essential hypertension History of hypertension blood pressure measured today at 124/68. She is on losartan. Continue current meds at current dosing    Hyperlipidemia History of hyperlipidemia on statin therapy followed by her PCP      Lorretta Harp MD [facility], [facility] 12/29/2015 11:56 AM

## 2015-12-29 NOTE — Assessment & Plan Note (Signed)
History of hypertension blood pressure measured today at 124/68. She is on losartan. Continue current meds at current dosing

## 2015-12-31 ENCOUNTER — Other Ambulatory Visit: Payer: Self-pay | Admitting: Cardiovascular Disease

## 2015-12-31 DIAGNOSIS — I739 Peripheral vascular disease, unspecified: Secondary | ICD-10-CM

## 2016-01-07 ENCOUNTER — Encounter: Payer: Self-pay | Admitting: Cardiothoracic Surgery

## 2016-01-07 ENCOUNTER — Ambulatory Visit (INDEPENDENT_AMBULATORY_CARE_PROVIDER_SITE_OTHER): Payer: Medicare Other | Admitting: Cardiothoracic Surgery

## 2016-01-07 ENCOUNTER — Ambulatory Visit
Admission: RE | Admit: 2016-01-07 | Discharge: 2016-01-07 | Disposition: A | Discharge status: Home / Self Care | Payer: Medicare Other | Source: Ambulatory Visit | Attending: Cardiothoracic Surgery | Admitting: Cardiothoracic Surgery

## 2016-01-07 VITALS — BP 152/83 | HR 71 | Temp 97.7°F | Resp 18 | Ht 65.0 in | Wt 147.4 lb

## 2016-01-07 DIAGNOSIS — R911 Solitary pulmonary nodule: Secondary | ICD-10-CM | POA: Diagnosis not present

## 2016-01-07 DIAGNOSIS — E041 Nontoxic single thyroid nodule: Secondary | ICD-10-CM | POA: Diagnosis not present

## 2016-01-07 DIAGNOSIS — J439 Emphysema, unspecified: Secondary | ICD-10-CM | POA: Insufficient documentation

## 2016-01-07 DIAGNOSIS — R918 Other nonspecific abnormal finding of lung field: Secondary | ICD-10-CM | POA: Insufficient documentation

## 2016-01-07 DIAGNOSIS — I700 Atherosclerosis of aorta: Secondary | ICD-10-CM | POA: Diagnosis not present

## 2016-01-07 HISTORY — DX: Type 2 diabetes mellitus without complications: E11.9

## 2016-01-07 MED ORDER — IOPAMIDOL (ISOVUE-300) INJECTION 61%
75.0000 mL | Freq: Once | INTRAVENOUS | Status: AC | PRN
  Administered 2016-01-07: 75 mL via INTRAVENOUS

## 2016-01-07 NOTE — Progress Notes (Signed)
  Patient ID: [name] [name], female   DOB: 11-29-1945, 70 y.o.   MRN: [id number]  HISTORY: She returns today in follow-up. She did have her CT scan done this morning. She states that she occasionally smokes a few cigarettes. She does get short of breath with exertion but not at baseline.   Vitals:   01/07/16 0951  BP: (!) 152/83  Pulse: 71  Resp: 18  Temp: 97.7 F (36.5 C)     EXAM:    Resp: Lungs are clear bilaterally.  No respiratory distress, normal effort. Heart:  Regular without murmurs Abd:  Abdomen is soft, non distended and non tender. No masses are palpable.  There is no rebound and no guarding.  Neurological: Alert and oriented to person, place, and time. Coordination normal.  Skin: Skin is warm and dry. No rash noted. No diaphoretic. No erythema. No pallor.  Psychiatric: Normal mood and affect. Normal behavior. Judgment and thought content normal.    ASSESSMENT: I have independently reviewed the patient's CT scan. There is a 1 cm nodule present in the superior segment of the right lower lobe. There are scattered other pulmonary nodules as well. All these are unchanged from January.   PLAN:   I discussed with her the options of surgical resection or biopsy or continued follow-up. She would like to continue the follow-up. She understands that only a biopsy or resection will give [place] a definitive diagnosis. We'll see her back again in 6 months time with another CT scan.    Nestor Lewandowsky, MD

## 2016-01-07 NOTE — Progress Notes (Signed)
Your CT scan has been scheduled for 07/08/15 @ 8:00am. Please see your follow up with Dr.Oaks listed below. Please call our office if you have questions or concerns.

## 2016-01-12 LAB — POCT I-STAT CREATININE: Creatinine, Ser: 0.8 mg/dL (ref 0.44–1.00)

## 2016-01-17 ENCOUNTER — Ambulatory Visit (HOSPITAL_COMMUNITY)
Admission: RE | Admit: 2016-01-17 | Discharge: 2016-01-17 | Disposition: A | Discharge status: Home / Self Care | Payer: Medicare Other | Source: Ambulatory Visit | Attending: Cardiology | Admitting: Cardiology

## 2016-01-17 DIAGNOSIS — I10 Essential (primary) hypertension: Secondary | ICD-10-CM | POA: Insufficient documentation

## 2016-01-17 DIAGNOSIS — Z87891 Personal history of nicotine dependence: Secondary | ICD-10-CM | POA: Insufficient documentation

## 2016-01-17 DIAGNOSIS — E1151 Type 2 diabetes mellitus with diabetic peripheral angiopathy without gangrene: Secondary | ICD-10-CM | POA: Diagnosis not present

## 2016-01-17 DIAGNOSIS — I739 Peripheral vascular disease, unspecified: Secondary | ICD-10-CM | POA: Diagnosis not present

## 2016-01-17 DIAGNOSIS — E785 Hyperlipidemia, unspecified: Secondary | ICD-10-CM | POA: Diagnosis not present

## 2016-01-17 DIAGNOSIS — I708 Atherosclerosis of other arteries: Secondary | ICD-10-CM | POA: Diagnosis not present

## 2016-01-17 DIAGNOSIS — R938 Abnormal findings on diagnostic imaging of other specified body structures: Secondary | ICD-10-CM | POA: Diagnosis not present

## 2016-01-17 DIAGNOSIS — I700 Atherosclerosis of aorta: Secondary | ICD-10-CM | POA: Diagnosis not present

## 2016-01-18 ENCOUNTER — Telehealth: Payer: Self-pay | Admitting: *Deleted

## 2016-01-18 DIAGNOSIS — I739 Peripheral vascular disease, unspecified: Secondary | ICD-10-CM

## 2016-01-18 DIAGNOSIS — I10 Essential (primary) hypertension: Secondary | ICD-10-CM

## 2016-01-18 NOTE — Telephone Encounter (Signed)
-----   Message from Lorretta Harp, MD sent at 01/18/2016  1:04 PM EST ----- Widely patent distal abdominal aortic stent by duplex ultrasound. Repeat 12 months

## 2016-02-28 DIAGNOSIS — Z6824 Body mass index (BMI) 24.0-24.9, adult: Secondary | ICD-10-CM | POA: Diagnosis not present

## 2016-02-28 DIAGNOSIS — E782 Mixed hyperlipidemia: Secondary | ICD-10-CM | POA: Diagnosis not present

## 2016-02-28 DIAGNOSIS — I10 Essential (primary) hypertension: Secondary | ICD-10-CM | POA: Diagnosis not present

## 2016-02-28 DIAGNOSIS — J449 Chronic obstructive pulmonary disease, unspecified: Secondary | ICD-10-CM | POA: Diagnosis not present

## 2016-02-28 DIAGNOSIS — Z1389 Encounter for screening for other disorder: Secondary | ICD-10-CM | POA: Diagnosis not present

## 2016-02-28 DIAGNOSIS — Z9181 History of falling: Secondary | ICD-10-CM | POA: Diagnosis not present

## 2016-02-28 DIAGNOSIS — E119 Type 2 diabetes mellitus without complications: Secondary | ICD-10-CM | POA: Diagnosis not present

## 2016-07-03 ENCOUNTER — Other Ambulatory Visit: Payer: Self-pay | Admitting: Cardiovascular Disease

## 2016-07-03 DIAGNOSIS — E663 Overweight: Secondary | ICD-10-CM | POA: Diagnosis not present

## 2016-07-03 DIAGNOSIS — M150 Primary generalized (osteo)arthritis: Secondary | ICD-10-CM | POA: Diagnosis not present

## 2016-07-03 DIAGNOSIS — I10 Essential (primary) hypertension: Secondary | ICD-10-CM | POA: Diagnosis not present

## 2016-07-03 DIAGNOSIS — Z6825 Body mass index (BMI) 25.0-25.9, adult: Secondary | ICD-10-CM | POA: Diagnosis not present

## 2016-07-03 DIAGNOSIS — J439 Emphysema, unspecified: Secondary | ICD-10-CM | POA: Diagnosis not present

## 2016-07-03 DIAGNOSIS — E782 Mixed hyperlipidemia: Secondary | ICD-10-CM | POA: Diagnosis not present

## 2016-07-03 DIAGNOSIS — E119 Type 2 diabetes mellitus without complications: Secondary | ICD-10-CM | POA: Diagnosis not present

## 2016-07-03 DIAGNOSIS — Z720 Tobacco use: Secondary | ICD-10-CM | POA: Diagnosis not present

## 2016-07-05 DIAGNOSIS — L578 Other skin changes due to chronic exposure to nonionizing radiation: Secondary | ICD-10-CM | POA: Diagnosis not present

## 2016-07-05 DIAGNOSIS — E119 Type 2 diabetes mellitus without complications: Secondary | ICD-10-CM | POA: Diagnosis not present

## 2016-07-05 DIAGNOSIS — D485 Neoplasm of uncertain behavior of skin: Secondary | ICD-10-CM | POA: Diagnosis not present

## 2016-07-05 DIAGNOSIS — I10 Essential (primary) hypertension: Secondary | ICD-10-CM | POA: Diagnosis not present

## 2016-07-05 DIAGNOSIS — C44622 Squamous cell carcinoma of skin of right upper limb, including shoulder: Secondary | ICD-10-CM | POA: Diagnosis not present

## 2016-07-07 ENCOUNTER — Ambulatory Visit: Payer: Medicare Other | Admitting: Cardiothoracic Surgery

## 2016-07-07 ENCOUNTER — Ambulatory Visit: Payer: Medicare Other

## 2016-07-10 ENCOUNTER — Ambulatory Visit
Admission: RE | Admit: 2016-07-10 | Discharge: 2016-07-10 | Disposition: A | Discharge status: Home / Self Care | Payer: Medicare Other | Source: Ambulatory Visit | Attending: Cardiothoracic Surgery | Admitting: Cardiothoracic Surgery

## 2016-07-10 DIAGNOSIS — I700 Atherosclerosis of aorta: Secondary | ICD-10-CM | POA: Insufficient documentation

## 2016-07-10 DIAGNOSIS — R918 Other nonspecific abnormal finding of lung field: Secondary | ICD-10-CM | POA: Diagnosis not present

## 2016-07-10 DIAGNOSIS — R911 Solitary pulmonary nodule: Secondary | ICD-10-CM | POA: Insufficient documentation

## 2016-07-10 DIAGNOSIS — E041 Nontoxic single thyroid nodule: Secondary | ICD-10-CM | POA: Insufficient documentation

## 2016-07-12 ENCOUNTER — Encounter: Payer: Self-pay | Admitting: Cardiothoracic Surgery

## 2016-07-12 ENCOUNTER — Telehealth: Payer: Self-pay

## 2016-07-12 ENCOUNTER — Ambulatory Visit (INDEPENDENT_AMBULATORY_CARE_PROVIDER_SITE_OTHER): Payer: Medicare Other | Admitting: Cardiothoracic Surgery

## 2016-07-12 DIAGNOSIS — R911 Solitary pulmonary nodule: Secondary | ICD-10-CM | POA: Diagnosis not present

## 2016-07-12 DIAGNOSIS — E041 Nontoxic single thyroid nodule: Secondary | ICD-10-CM

## 2016-07-12 NOTE — Addendum Note (Signed)
Addended by: Wayna Chalet on: 07/12/2016 12:11 PM   Modules accepted: Orders

## 2016-07-12 NOTE — Telephone Encounter (Signed)
Called patient but had to leave her a detailed message letting her know that I had scheduled an ultrasound [facility] 07/24/2016) and labs to be drawn on the same day. I printed her reminder appointment sheet as well as her labs so she could have them done on the same day. Then we will see her back on 07/26/2016.  This information was mailed.

## 2016-07-12 NOTE — Progress Notes (Signed)
  Patient ID: [name] [name], female   DOB: 04-01-1945, 71 y.o.   MRN: [id number]  HISTORY: Miss [name] [name] returns today in follow-up. She has no new complaints. She states that she has not been short of breath. She did quit smoking back in December and has had no cigarettes since then. We've been following her for a right lower lobe nodule. She remains asymptomatic.   Vitals:   07/12/16 1003  BP: (!) 179/142  Pulse: 79  Temp: 97.9 F (36.6 C)     EXAM:    Resp: Lungs are clear bilaterally with the exception of some dry crackles at both bases.  No respiratory distress, normal effort. Heart:  Regular without murmurs Abd:  Abdomen is soft, non distended and non tender. No masses are palpable.  There is no rebound and no guarding.  Neurological: Alert and oriented to person, place, and time. Coordination normal.  Skin: Skin is warm and dry. No rash noted. No diaphoretic. No erythema. No pallor.  Psychiatric: Normal mood and affect. Normal behavior. Judgment and thought content normal.    ASSESSMENT: I have independently reviewed the patient's CT scan. The right lower lobe nodule is stable in size. The left thyroid nodule appears more prominent to me today. Despite the fact that this was PET negative I would like her to see one of our surgeons regarding that nodule.   PLAN:   I will asked Dr. Bea Graff poor bone to see the patient regarding her thyroid nodule. I would like to see her back again in about 10 months for her 2 year anniversary CT scan. If the lung nodule is stable at that time we can consider it benign. She will follow-up with Dr. San Jetty once he has the ultrasound of her thyroid performed.    [name] Lewandowsky, MD

## 2016-07-12 NOTE — Patient Instructions (Signed)
Please remember to go to the [place] and have your Ultrasound and labs drawn on the same day. I will call you with your Ultrasound appointment. We will see you back in 2 weeks.

## 2016-07-24 ENCOUNTER — Ambulatory Visit
Admission: RE | Admit: 2016-07-24 | Discharge: 2016-07-24 | Disposition: A | Discharge status: Home / Self Care | Payer: Medicare Other | Source: Ambulatory Visit | Attending: Surgery | Admitting: Surgery

## 2016-07-24 ENCOUNTER — Other Ambulatory Visit
Admission: RE | Admit: 2016-07-24 | Discharge: 2016-07-24 | Disposition: A | Discharge status: Home / Self Care | Payer: Medicare Other | Source: Ambulatory Visit | Attending: Cardiothoracic Surgery | Admitting: Cardiothoracic Surgery

## 2016-07-24 DIAGNOSIS — E041 Nontoxic single thyroid nodule: Secondary | ICD-10-CM | POA: Insufficient documentation

## 2016-07-24 DIAGNOSIS — R911 Solitary pulmonary nodule: Secondary | ICD-10-CM | POA: Insufficient documentation

## 2016-07-24 DIAGNOSIS — E042 Nontoxic multinodular goiter: Secondary | ICD-10-CM | POA: Insufficient documentation

## 2016-07-24 LAB — T4, FREE: Free T4: 0.95 ng/dL (ref 0.61–1.12)

## 2016-07-24 LAB — TSH: TSH: 0.691 u[IU]/mL (ref 0.350–4.500)

## 2016-07-25 LAB — T3, FREE: T3 FREE: 3.8 pg/mL (ref 2.0–4.4)

## 2016-07-26 ENCOUNTER — Encounter: Payer: Self-pay | Admitting: Surgery

## 2016-07-26 ENCOUNTER — Ambulatory Visit (INDEPENDENT_AMBULATORY_CARE_PROVIDER_SITE_OTHER): Payer: Medicare Other | Admitting: Surgery

## 2016-07-26 VITALS — BP 159/83 | HR 91 | Temp 99.1°F | Ht 65.0 in | Wt 147.7 lb

## 2016-07-26 DIAGNOSIS — E041 Nontoxic single thyroid nodule: Secondary | ICD-10-CM | POA: Diagnosis not present

## 2016-07-26 NOTE — Patient Instructions (Addendum)
We will see you back in 1 year. I will call you when it is time to make this appointment. We will do another Thyroid Ultrasound, TSH, Free T3, and Free T4 prior to your appointment.  Please call with any other questions or concerns that you have prior to your appointment in 1 year.

## 2016-07-26 NOTE — Progress Notes (Addendum)
Surgical Consultation  07/26/2016  [name] [name] is an 71 y.o. female.   Chief Complaint  Patient presents with  . New Patient (Initial Visit)    Thyroid Nodule     HPI: Pt referred by Dr. Genevive Bi for thyroid nodule. She has been f/u By Dr. Genevive Bi for pulm nodule, on PET CT scan ( personally reviewed) a left thyroid nodule that was not hyperactive. Pt denies any sxs. She is stable from her lung nodule perspective.  I did order an ultrasound  (pers. Reviewed)of the thyroid showing multinodular goiter with a 2.7 cm nodule on the left load the correlates with the CT findings. Her TSH and t3, t4 are nml. He denies any obstructive symptoms. No stridor no dysphagia. No fevers, chills or SOB. Case d/w Dr. Genevive Bi in detail  Past Medical History:  Diagnosis Date  . COPD (chronic obstructive pulmonary disease) ([place])   . Diabetes mellitus without complication ([place])   . Emphysema of lung ([place])   . Hyperlipidemia   . Hypertension   . Personal history of tobacco use, presenting hazards to health 04/01/2015  . PVD (peripheral vascular disease) ([place])    abdominal aorta stent    Past Surgical History:  Procedure Laterality Date  . ABDOMINAL AORTA STENT  12/19/2006   PTA & stent with 4x14 nitinol self-expanding stent with IBIS guidance - diagnostic cath 11/15/2006 (Dr. Adora Fridge)  . ABDOMINAL HYSTERECTOMY  1980   Total  . [place]  . NM MYOCAR PERF WALL MOTION  10/2006   persantine - EF 77%, breast attenuation, low risk, normal perfusion    Family History  Problem Relation Age of Onset  . Stroke Mother   . Lung disease Father   . Heart attack Brother        in 52s  . Heart disease Brother   . Cancer Maternal Aunt        Breat Cancer  . Cancer Maternal Grandmother        Breast Cancer    Social History:  reports that she quit smoking about 8 years ago. Her smoking use included Cigarettes. She has a 45.00 pack-year smoking history. She quit smokeless tobacco use about 4 years ago.  She reports that she does not drink alcohol or use drugs.  Allergies:  Allergies  Allergen Reactions  . Codeine Photosensitivity  . Crestor [Rosuvastatin] Other (See Comments)    Muscle pain  . Lisinopril Cough    Medications reviewed.   ROS Full ROS performed and is otherwise negative other than what is stated in the HPI    BP (!) 159/83   Pulse 91   Temp 99.1 F (37.3 C) (Oral)   Ht 5\' 5"  (1.651 m)   Wt 67 kg (147 lb 11.2 oz)   BMI 24.58 kg/m   Physical Exam  Constitutional: She is oriented to person, place, and time and well-developed, well-nourished, and in no distress. No distress.  Neck: Normal range of motion. No JVD present. No tracheal deviation present.  Generous thyroid w/o any discrete nodule or masses  Cardiovascular: Normal rate, regular rhythm and normal heart sounds.  Exam reveals no friction rub.   Pulmonary/Chest: Effort normal and breath sounds normal. No respiratory distress. She has no wheezes. She has no rales. She exhibits no tenderness.  Abdominal: Soft. She exhibits no distension. There is no tenderness. There is no rebound.  Musculoskeletal: Normal range of motion. She exhibits no edema or deformity.  Lymphadenopathy:    She has  no cervical adenopathy.  Neurological: She is alert and oriented to person, place, and time. Gait normal. GCS score is 15.  Skin: Skin is warm and dry. She is not diaphoretic.  Psychiatric: Mood, memory, affect and judgment normal.  Nursing note and vitals reviewed.     Results for orders placed or performed during the hospital encounter of 07/24/16 (from the past 48 hour(s))  TSH     Status: None   Collection Time: 07/24/16  3:38 PM  Result Value Ref Range   TSH 0.691 0.350 - 4.500 uIU/mL    Comment: Performed by a 3rd Generation assay with a functional sensitivity of <=0.01 uIU/mL.  T3, free     Status: None   Collection Time: 07/24/16  3:38 PM  Result Value Ref Range   T3, Free 3.8 2.0 - 4.4 pg/mL     Comment: (NOTE) Performed At: [facility] [address] [place], [place] [id number] Lindon Romp MD EX:[id number]   T4, free     Status: None   Collection Time: 07/24/16  3:38 PM  Result Value Ref Range   Free T4 0.95 0.61 - 1.12 ng/dL    Comment: (NOTE) Biotin ingestion may interfere with free T4 tests. If the results are inconsistent with the TSH level, previous test results, or the clinical presentation, then consider biotin interference. If needed, order repeat testing after stopping biotin.    US Thyroid  Result Date: 07/24/2016 CLINICAL DATA:  Incidental on CT. Thyroid nodules seen on preceding chest CT. EXAM: THYROID ULTRASOUND TECHNIQUE: Ultrasound examination of the thyroid gland and adjacent soft tissues was performed. COMPARISON:  Chest CT - 07/10/2016 FINDINGS: Parenchymal Echotexture: Mildly heterogenous Isthmus: Normal in size measures 0.3 cm in diameter Right lobe: Normal in size measuring 4.6 x 1.7 x 1.4 cm Left lobe: Normal in size measuring 4.2 x 1.8 x 2.0 cm _________________________________________________________ Estimated total number of nodules >/= 1 cm: 1 Number of spongiform nodules >/=  2 cm not described below (TR1): 0 Number of mixed cystic and solid nodules >/= 1.5 cm not described below (TR2): 0 _________________________________________________________ Nodule # 2: Location: Left; Mid - this nodule correlates with the nodule seen on preceding chest CT Maximum size: 2.6 cm; Other 2 dimensions: 1.8 x 1.6 cm Composition: mixed cystic and solid (1) Echogenicity: isoechoic (1) Shape: not taller-than-wide (0) Margins: smooth (0) Echogenic foci: large comet-tail artifacts (0) ACR TI-RADS total points: 2. ACR TI-RADS risk category: TR2 (2 points). ACR TI-RADS recommendations: This nodule does NOT meet TI-RADS criteria for biopsy or dedicated follow-up. _________________________________________________________ There are 2 adjacent punctate (< 0.6 cm) partially  cystic, partially solid nodules within the right lobe of the thyroid, neither of which meet imaging criteria to recommend percutaneous sampling or dedicated follow-up. IMPRESSION: 1. Findings suggestive of multinodular goiter. 2. The dominant partially cystic, partially solid approximately 2.6 cm nodule within the left lobe of the thyroid correlates with the nodule seen on preceding chest CT and does not meet imaging criteria to recommend percutaneous sampling or dedicated follow-up. The above is in keeping with the ACR TI-RADS recommendations - J Am Coll Radiol 2017;14:587-595. Electronically Signed   By: Sandi Mariscal M.D.   On: 07/24/2016 16:25    Assessment/Plan: Multinodular goiter w Benign left thyroid nodule, nml TSH. D/W the pt in detail about options. We will continue to monitor her nodule. D/W her the role of synthroid in bening thyroid nodule. She wishes to continue watchful waiting. [name] RTC 1 year w U/S and  TFT. No need for biopsy or surgical intervention at this time.  Caroleen Hamman, MD [facility] General Surgeon

## 2016-08-15 DIAGNOSIS — Z85828 Personal history of other malignant neoplasm of skin: Secondary | ICD-10-CM | POA: Diagnosis not present

## 2016-08-15 DIAGNOSIS — L570 Actinic keratosis: Secondary | ICD-10-CM | POA: Diagnosis not present

## 2016-08-15 DIAGNOSIS — L821 Other seborrheic keratosis: Secondary | ICD-10-CM | POA: Diagnosis not present

## 2016-08-15 DIAGNOSIS — L578 Other skin changes due to chronic exposure to nonionizing radiation: Secondary | ICD-10-CM | POA: Diagnosis not present

## 2016-08-15 DIAGNOSIS — L820 Inflamed seborrheic keratosis: Secondary | ICD-10-CM | POA: Diagnosis not present

## 2016-08-15 DIAGNOSIS — L720 Epidermal cyst: Secondary | ICD-10-CM | POA: Diagnosis not present

## 2016-11-14 DIAGNOSIS — E782 Mixed hyperlipidemia: Secondary | ICD-10-CM | POA: Diagnosis not present

## 2016-11-14 DIAGNOSIS — M158 Other polyosteoarthritis: Secondary | ICD-10-CM | POA: Diagnosis not present

## 2016-11-14 DIAGNOSIS — M25562 Pain in left knee: Secondary | ICD-10-CM | POA: Diagnosis not present

## 2016-11-14 DIAGNOSIS — J439 Emphysema, unspecified: Secondary | ICD-10-CM | POA: Diagnosis not present

## 2016-11-14 DIAGNOSIS — I10 Essential (primary) hypertension: Secondary | ICD-10-CM | POA: Diagnosis not present

## 2016-11-14 DIAGNOSIS — Z6824 Body mass index (BMI) 24.0-24.9, adult: Secondary | ICD-10-CM | POA: Diagnosis not present

## 2016-11-14 DIAGNOSIS — E119 Type 2 diabetes mellitus without complications: Secondary | ICD-10-CM | POA: Diagnosis not present

## 2016-12-11 DIAGNOSIS — Z1231 Encounter for screening mammogram for malignant neoplasm of breast: Secondary | ICD-10-CM | POA: Diagnosis not present

## 2016-12-11 DIAGNOSIS — Z23 Encounter for immunization: Secondary | ICD-10-CM | POA: Diagnosis not present

## 2016-12-11 DIAGNOSIS — Z1389 Encounter for screening for other disorder: Secondary | ICD-10-CM | POA: Diagnosis not present

## 2016-12-11 DIAGNOSIS — Z0000 Encounter for general adult medical examination without abnormal findings: Secondary | ICD-10-CM | POA: Diagnosis not present

## 2016-12-11 DIAGNOSIS — N959 Unspecified menopausal and perimenopausal disorder: Secondary | ICD-10-CM | POA: Diagnosis not present

## 2016-12-11 DIAGNOSIS — Z9181 History of falling: Secondary | ICD-10-CM | POA: Diagnosis not present

## 2016-12-11 DIAGNOSIS — Z136 Encounter for screening for cardiovascular disorders: Secondary | ICD-10-CM | POA: Diagnosis not present

## 2016-12-11 DIAGNOSIS — E785 Hyperlipidemia, unspecified: Secondary | ICD-10-CM | POA: Diagnosis not present

## 2016-12-25 DIAGNOSIS — M8580 Other specified disorders of bone density and structure, unspecified site: Secondary | ICD-10-CM | POA: Diagnosis not present

## 2016-12-25 DIAGNOSIS — Z780 Asymptomatic menopausal state: Secondary | ICD-10-CM | POA: Diagnosis not present

## 2016-12-25 DIAGNOSIS — Z1231 Encounter for screening mammogram for malignant neoplasm of breast: Secondary | ICD-10-CM | POA: Diagnosis not present

## 2016-12-25 DIAGNOSIS — M8588 Other specified disorders of bone density and structure, other site: Secondary | ICD-10-CM | POA: Diagnosis not present

## 2017-01-02 ENCOUNTER — Other Ambulatory Visit: Payer: Self-pay | Admitting: Cardiovascular Disease

## 2017-01-02 DIAGNOSIS — I739 Peripheral vascular disease, unspecified: Secondary | ICD-10-CM

## 2017-01-09 DIAGNOSIS — H40003 Preglaucoma, unspecified, bilateral: Secondary | ICD-10-CM | POA: Diagnosis not present

## 2017-01-09 DIAGNOSIS — E119 Type 2 diabetes mellitus without complications: Secondary | ICD-10-CM | POA: Diagnosis not present

## 2017-01-15 ENCOUNTER — Ambulatory Visit (HOSPITAL_BASED_OUTPATIENT_CLINIC_OR_DEPARTMENT_OTHER)
Admission: RE | Admit: 2017-01-15 | Discharge: 2017-01-15 | Disposition: A | Discharge status: Home / Self Care | Payer: Medicare Other | Source: Ambulatory Visit | Attending: Cardiovascular Disease | Admitting: Cardiovascular Disease

## 2017-01-15 ENCOUNTER — Ambulatory Visit (HOSPITAL_COMMUNITY)
Admission: RE | Admit: 2017-01-15 | Discharge: 2017-01-15 | Disposition: A | Discharge status: Home / Self Care | Payer: Medicare Other | Source: Ambulatory Visit | Attending: Cardiovascular Disease | Admitting: Cardiovascular Disease

## 2017-01-15 DIAGNOSIS — I10 Essential (primary) hypertension: Secondary | ICD-10-CM

## 2017-01-15 DIAGNOSIS — Z85820 Personal history of malignant melanoma of skin: Secondary | ICD-10-CM | POA: Diagnosis not present

## 2017-01-15 DIAGNOSIS — I700 Atherosclerosis of aorta: Secondary | ICD-10-CM | POA: Insufficient documentation

## 2017-01-15 DIAGNOSIS — R9439 Abnormal result of other cardiovascular function study: Secondary | ICD-10-CM | POA: Insufficient documentation

## 2017-01-15 DIAGNOSIS — I739 Peripheral vascular disease, unspecified: Secondary | ICD-10-CM | POA: Insufficient documentation

## 2017-01-18 ENCOUNTER — Other Ambulatory Visit: Payer: Self-pay | Admitting: Cardiovascular Disease

## 2017-01-18 DIAGNOSIS — I739 Peripheral vascular disease, unspecified: Secondary | ICD-10-CM

## 2017-01-19 ENCOUNTER — Ambulatory Visit (INDEPENDENT_AMBULATORY_CARE_PROVIDER_SITE_OTHER): Payer: Medicare Other | Admitting: Cardiovascular Disease

## 2017-01-19 ENCOUNTER — Encounter: Payer: Self-pay | Admitting: Cardiovascular Disease

## 2017-01-19 VITALS — BP 148/74 | HR 71 | Ht 65.0 in | Wt 144.0 lb

## 2017-01-19 DIAGNOSIS — E7800 Pure hypercholesterolemia, unspecified: Secondary | ICD-10-CM

## 2017-01-19 DIAGNOSIS — I10 Essential (primary) hypertension: Secondary | ICD-10-CM

## 2017-01-19 DIAGNOSIS — I739 Peripheral vascular disease, unspecified: Secondary | ICD-10-CM

## 2017-01-19 NOTE — Assessment & Plan Note (Signed)
History of peripheral arterial disease status post distal abdominal aorta stenting because of a aortic flap by myself 11/15/06 with a 14 mm x 4 cm marked nitinol self expanding stent. This resulted in improvement in her claudication symptoms. Recent follow-up Dopplers revealed this to be widely patent.

## 2017-01-19 NOTE — Assessment & Plan Note (Signed)
History of essential hypertension blood pressure measured 140/74. She is on losartan. Continue current meds at current dosing.

## 2017-01-19 NOTE — Assessment & Plan Note (Signed)
History of hyperlipidemia on statin therapy followed by her PCP. 

## 2017-01-19 NOTE — Progress Notes (Signed)
01/19/2017 Sabrie Moritz Propp   04-08-1945  [id number]  Primary Physician Philmore Pali, NP Primary Cardiologist: Lorretta Harp MD Lupe Carney, [place]  HPI:  [name] [name] is a 71 y.o. female thin-appearing, widowed Caucasian female, mother of 1 child, grandmother to 2 grandchildren who I saw  12/29/15.[name] She has a history of discontinued tobacco abuse, having stopped on September 10, 2009. She did have 45-pack-year history prior to that. She has hypertension and hyperlipidemia as well. Because of claudication, I angiogram'd her on November 15, 2006, revealing a distal abdominal aortic flap which I ultimately stented 1 month later with a 4-x-14 SMART Nitinol self-expanding stent. Followup Dopplers were normal and her claudication resolved. Dr. Lisbeth Ply follows her lipid profile. She does get short of breath probably related to COPD. She had a Myoview stress test performed in 2008 which was normal. Dopplers performed 01/16/17, revealed ABIs of 1 bilaterally with normal abdominal aortic waveform and a patent distal abdominal aortic stent.. Since I saw last  she denies chest pain, shortness of breath or claudication.     Current Meds  Medication Sig  . alendronate (FOSAMAX) 70 MG tablet Take 70 mg by mouth once a week. Take with a full glass of water on an empty stomach.  [name] aspirin 325 MG tablet Take 325 mg by mouth daily.  . clopidogrel (PLAVIX) 75 MG tablet TAKE ONE TABLET BY MOUTH ONCE DAILY WITH BREAKFAST  . losartan (COZAAR) 25 MG tablet Take 25 mg by mouth daily.  . metFORMIN (GLUCOPHAGE-XR) 500 MG 24 hr tablet Take 500 mg by mouth daily.   . Multiple Vitamin (MULTIVITAMIN) capsule Take 1 capsule by mouth daily.  . pravastatin (PRAVACHOL) 80 MG tablet Take 80 mg by mouth daily.   [name] SPIRIVA HANDIHALER 18 MCG inhalation capsule Place 1 puff into inhaler and inhale daily as needed.     Allergies  Allergen Reactions  . Codeine Photosensitivity  . Crestor [Rosuvastatin] Other (See Comments)      Muscle pain  . Lisinopril Cough    Social History   Socioeconomic History  . Marital status: Married    Spouse name: Not on file  . Number of children: 1  . Years of education: Not on file  . Highest education level: Not on file  Social Needs  . Financial resource strain: Not on file  . Food insecurity - worry: Not on file  . Food insecurity - inability: Not on file  . Transportation needs - medical: Not on file  . Transportation needs - non-medical: Not on file  Occupational History  . Not on file  Tobacco Use  . Smoking status: Former Smoker    Packs/day: 1.00    Years: 45.00    Pack years: 45.00    Types: Cigarettes    Last attempt to quit: 03/13/2008    Years since quitting: 8.8  . Smokeless tobacco: Former Systems developer    Quit date: 07/16/2012  Substance and Sexual Activity  . Alcohol use: No  . Drug use: No  . Sexual activity: Not on file  Other Topics Concern  . Not on file  Social History Narrative  . Not on file     Review of Systems: General: negative for chills, fever, night sweats or weight changes.  Cardiovascular: negative for chest pain, dyspnea on exertion, edema, orthopnea, palpitations, paroxysmal nocturnal dyspnea or shortness of breath Dermatological: negative for rash Respiratory: negative for cough or wheezing Urologic: negative for hematuria Abdominal: negative  for nausea, vomiting, diarrhea, bright red blood per rectum, melena, or hematemesis Neurologic: negative for visual changes, syncope, or dizziness All other systems reviewed and are otherwise negative except as noted above.    Blood pressure (!) 148/74, pulse 71, height 5\' 5"  (1.651 m), weight 144 lb (65.3 kg).  General appearance: alert and no distress Neck: no adenopathy, no carotid bruit, no JVD, supple, symmetrical, trachea midline and thyroid not enlarged, symmetric, no tenderness/mass/nodules Lungs: clear to auscultation bilaterally Heart: regular rate and rhythm, S1, S2 normal, no  murmur, click, rub or gallop Extremities: extremities normal, atraumatic, no cyanosis or edema Pulses: 2+ and symmetric Skin: Skin color, texture, turgor normal. No rashes or lesions Neurologic: Alert and oriented X 3, normal strength and tone. Normal symmetric reflexes. Normal coordination and gait  EKG sinus rhythm at 74 without ST or T-wave changes. I personally reviewed this EKG.  ASSESSMENT AND PLAN:   Peripheral arterial disease History of peripheral arterial disease status post distal abdominal aorta stenting because of a aortic flap by myself 11/15/06 with a 14 mm x 4 cm marked nitinol self expanding stent. This resulted in improvement in her claudication symptoms. Recent follow-up Dopplers revealed this to be widely patent.  Essential hypertension History of essential hypertension blood pressure measured 140/74. She is on losartan. Continue current meds at current dosing.  Hyperlipidemia History of hyperlipidemia on statin therapy followed by her PCP      Lorretta Harp MD [facility], [facility] 01/19/2017 11:38 AM

## 2017-01-19 NOTE — Patient Instructions (Signed)
Medication Instructions: Your physician recommends that you continue on your current medications as directed. Please refer to the Current Medication list given to you today.  Testing: Your physician has requested that you have a aorta and iliac duplex. During this test, an ultrasound is used to evaluate blood flow to the aorta and iliac arteries. Allow one hour for this exam. Do not eat after midnight the day before and avoid carbonated beverages.  (IN 1 Year)  Follow-Up: Your physician wants you to follow-up in: 1 year with Dr. Gwenlyn Found. You will receive a reminder letter in the mail two months in advance. If you don't receive a letter, please call our office to schedule the follow-up appointment.  If you need a refill on your cardiac medications before your next appointment, please call your pharmacy.

## 2017-01-30 ENCOUNTER — Other Ambulatory Visit: Payer: Self-pay | Admitting: Cardiovascular Disease

## 2017-01-30 NOTE — Telephone Encounter (Signed)
REFILL 

## 2017-03-19 DIAGNOSIS — D1800 Hemangioma unspecified site: Secondary | ICD-10-CM | POA: Diagnosis not present

## 2017-03-19 DIAGNOSIS — D485 Neoplasm of uncertain behavior of skin: Secondary | ICD-10-CM | POA: Diagnosis not present

## 2017-03-19 DIAGNOSIS — L578 Other skin changes due to chronic exposure to nonionizing radiation: Secondary | ICD-10-CM | POA: Diagnosis not present

## 2017-03-19 DIAGNOSIS — D229 Melanocytic nevi, unspecified: Secondary | ICD-10-CM | POA: Diagnosis not present

## 2017-03-19 DIAGNOSIS — L812 Freckles: Secondary | ICD-10-CM | POA: Diagnosis not present

## 2017-03-19 DIAGNOSIS — Z1283 Encounter for screening for malignant neoplasm of skin: Secondary | ICD-10-CM | POA: Diagnosis not present

## 2017-03-19 DIAGNOSIS — C44319 Basal cell carcinoma of skin of other parts of face: Secondary | ICD-10-CM | POA: Diagnosis not present

## 2017-03-19 DIAGNOSIS — L570 Actinic keratosis: Secondary | ICD-10-CM | POA: Diagnosis not present

## 2017-03-19 DIAGNOSIS — L821 Other seborrheic keratosis: Secondary | ICD-10-CM | POA: Diagnosis not present

## 2017-03-19 DIAGNOSIS — Z85828 Personal history of other malignant neoplasm of skin: Secondary | ICD-10-CM | POA: Diagnosis not present

## 2017-04-17 DIAGNOSIS — L821 Other seborrheic keratosis: Secondary | ICD-10-CM | POA: Diagnosis not present

## 2017-04-17 DIAGNOSIS — L820 Inflamed seborrheic keratosis: Secondary | ICD-10-CM | POA: Diagnosis not present

## 2017-04-17 DIAGNOSIS — L578 Other skin changes due to chronic exposure to nonionizing radiation: Secondary | ICD-10-CM | POA: Diagnosis not present

## 2017-04-17 DIAGNOSIS — C44319 Basal cell carcinoma of skin of other parts of face: Secondary | ICD-10-CM | POA: Diagnosis not present

## 2017-05-14 DIAGNOSIS — Z6824 Body mass index (BMI) 24.0-24.9, adult: Secondary | ICD-10-CM | POA: Diagnosis not present

## 2017-05-14 DIAGNOSIS — J439 Emphysema, unspecified: Secondary | ICD-10-CM | POA: Diagnosis not present

## 2017-05-14 DIAGNOSIS — E119 Type 2 diabetes mellitus without complications: Secondary | ICD-10-CM | POA: Diagnosis not present

## 2017-05-14 DIAGNOSIS — E042 Nontoxic multinodular goiter: Secondary | ICD-10-CM | POA: Diagnosis not present

## 2017-05-14 DIAGNOSIS — M810 Age-related osteoporosis without current pathological fracture: Secondary | ICD-10-CM | POA: Diagnosis not present

## 2017-05-14 DIAGNOSIS — I10 Essential (primary) hypertension: Secondary | ICD-10-CM | POA: Diagnosis not present

## 2017-05-14 DIAGNOSIS — E782 Mixed hyperlipidemia: Secondary | ICD-10-CM | POA: Diagnosis not present

## 2017-05-14 DIAGNOSIS — Z1331 Encounter for screening for depression: Secondary | ICD-10-CM | POA: Diagnosis not present

## 2017-06-27 ENCOUNTER — Telehealth: Payer: Self-pay | Admitting: *Deleted

## 2017-06-27 DIAGNOSIS — Z87891 Personal history of nicotine dependence: Secondary | ICD-10-CM

## 2017-06-27 DIAGNOSIS — Z122 Encounter for screening for malignant neoplasm of respiratory organs: Secondary | ICD-10-CM

## 2017-06-27 NOTE — Telephone Encounter (Signed)
Notified patient that annual lung cancer screening low dose CT scan is due currently or will be in near future. Confirmed that patient is within the age range of 55-77, and asymptomatic, (no signs or symptoms of lung cancer). Patient denies illness that would prevent curative treatment for lung cancer if found. Verified smoking history, (former, quit 2010, 45 pack year). The shared decision making visit was done 04/02/15. Patient is agreeable for CT scan being scheduled.

## 2017-07-02 ENCOUNTER — Other Ambulatory Visit: Payer: Self-pay

## 2017-07-02 DIAGNOSIS — R911 Solitary pulmonary nodule: Secondary | ICD-10-CM

## 2017-07-10 ENCOUNTER — Ambulatory Visit
Admission: RE | Admit: 2017-07-10 | Discharge: 2017-07-10 | Disposition: A | Discharge status: Home / Self Care | Payer: Medicare Other | Source: Ambulatory Visit | Attending: Nurse Practitioner | Admitting: Nurse Practitioner

## 2017-07-10 DIAGNOSIS — Z87891 Personal history of nicotine dependence: Secondary | ICD-10-CM

## 2017-07-10 DIAGNOSIS — J439 Emphysema, unspecified: Secondary | ICD-10-CM | POA: Diagnosis not present

## 2017-07-10 DIAGNOSIS — Z122 Encounter for screening for malignant neoplasm of respiratory organs: Secondary | ICD-10-CM

## 2017-07-10 DIAGNOSIS — I2510 Atherosclerotic heart disease of native coronary artery without angina pectoris: Secondary | ICD-10-CM | POA: Diagnosis not present

## 2017-07-10 DIAGNOSIS — I700 Atherosclerosis of aorta: Secondary | ICD-10-CM | POA: Diagnosis not present

## 2017-07-10 DIAGNOSIS — F17210 Nicotine dependence, cigarettes, uncomplicated: Secondary | ICD-10-CM | POA: Diagnosis not present

## 2017-07-13 ENCOUNTER — Encounter: Payer: Self-pay | Admitting: Cardiothoracic Surgery

## 2017-07-13 ENCOUNTER — Ambulatory Visit (INDEPENDENT_AMBULATORY_CARE_PROVIDER_SITE_OTHER): Payer: Medicare Other | Admitting: Cardiothoracic Surgery

## 2017-07-13 VITALS — BP 183/70 | HR 76 | Temp 97.8°F | Wt 144.0 lb

## 2017-07-13 DIAGNOSIS — R911 Solitary pulmonary nodule: Secondary | ICD-10-CM | POA: Diagnosis not present

## 2017-07-13 NOTE — Progress Notes (Signed)
  Patient ID: [name] [name], female   DOB: 07-26-1945, 72 y.o.   MRN: [id number]  HISTORY: Ms. Dimas Millin returns today in follow-up.  She states that she thinks that her breathing is significantly impaired.  She does get short of breath.  She is unable to walk up a flight of stairs.   Vitals:   07/13/17 0947  BP: (!) 183/70  Pulse: 76  Temp: 97.8 F (36.6 C)     EXAM:    Resp: Lungs are clear bilaterally.  No respiratory distress, normal effort. Heart:  Regular without murmurs Abd:  Abdomen is soft, non distended and non tender. No masses are palpable.  There is no rebound and no guarding.  Neurological: Alert and oriented to person, place, and time. Coordination normal.  Skin: Skin is warm and dry. No rash noted. No diaphoretic. No erythema. No pallor.  Psychiatric: Normal mood and affect. Normal behavior. Judgment and thought content normal.    ASSESSMENT: I have independently reviewed the patient's CT scan.  She had a CT of the chest made in 2012 and we compared multiple scans over the years.  There is a nodule just posterior to the right mainstem bronchus in the lower lobe which has increased in size over the years.  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  She states that she does not want any surgical intervention no matter what she has.  In addition she states that she would not undergo any therapy for her lung cancer if it turned out that that is what she had.  I did recommend that we obtain a CT-guided needle biopsy but she has declined that at the time being.  I explained to her that there is no need to continue to follow the lung nodule if we were not going to act upon it.  She understands.  She will talk to her son and she will be back in touch with [place] whether or not she wants to get a CT-guided needle biopsy or just stop following this altogether.    Nestor Lewandowsky, MD

## 2017-07-13 NOTE — Patient Instructions (Signed)
Please give [place] a call once you decide on either doing the CT Guided Lung biopsy or nothing.

## 2017-07-18 DIAGNOSIS — L237 Allergic contact dermatitis due to plants, except food: Secondary | ICD-10-CM | POA: Diagnosis not present

## 2017-07-18 DIAGNOSIS — Z6824 Body mass index (BMI) 24.0-24.9, adult: Secondary | ICD-10-CM | POA: Diagnosis not present

## 2017-07-18 DIAGNOSIS — E119 Type 2 diabetes mellitus without complications: Secondary | ICD-10-CM | POA: Diagnosis not present

## 2017-11-19 DIAGNOSIS — I10 Essential (primary) hypertension: Secondary | ICD-10-CM | POA: Diagnosis not present

## 2017-11-19 DIAGNOSIS — E782 Mixed hyperlipidemia: Secondary | ICD-10-CM | POA: Diagnosis not present

## 2017-11-19 DIAGNOSIS — J439 Emphysema, unspecified: Secondary | ICD-10-CM | POA: Diagnosis not present

## 2017-11-19 DIAGNOSIS — E119 Type 2 diabetes mellitus without complications: Secondary | ICD-10-CM | POA: Diagnosis not present

## 2017-11-19 DIAGNOSIS — E042 Nontoxic multinodular goiter: Secondary | ICD-10-CM | POA: Diagnosis not present

## 2017-11-19 DIAGNOSIS — Z1339 Encounter for screening examination for other mental health and behavioral disorders: Secondary | ICD-10-CM | POA: Diagnosis not present

## 2018-01-16 ENCOUNTER — Other Ambulatory Visit: Payer: Self-pay | Admitting: Cardiovascular Disease

## 2018-01-16 DIAGNOSIS — Z95820 Peripheral vascular angioplasty status with implants and grafts: Secondary | ICD-10-CM

## 2018-01-16 DIAGNOSIS — Z23 Encounter for immunization: Secondary | ICD-10-CM | POA: Diagnosis not present

## 2018-01-16 DIAGNOSIS — I739 Peripheral vascular disease, unspecified: Secondary | ICD-10-CM

## 2018-01-17 ENCOUNTER — Encounter (HOSPITAL_COMMUNITY): Payer: Medicare Other

## 2018-01-21 DIAGNOSIS — Z1231 Encounter for screening mammogram for malignant neoplasm of breast: Secondary | ICD-10-CM | POA: Diagnosis not present

## 2018-01-22 ENCOUNTER — Ambulatory Visit (HOSPITAL_COMMUNITY)
Admission: RE | Admit: 2018-01-22 | Discharge: 2018-01-22 | Disposition: A | Discharge status: Home / Self Care | Payer: Medicare Other | Source: Ambulatory Visit | Attending: Internal Medicine | Admitting: Internal Medicine

## 2018-01-22 ENCOUNTER — Ambulatory Visit (HOSPITAL_BASED_OUTPATIENT_CLINIC_OR_DEPARTMENT_OTHER)
Admission: RE | Admit: 2018-01-22 | Discharge: 2018-01-22 | Disposition: A | Discharge status: Home / Self Care | Payer: Medicare Other | Source: Ambulatory Visit | Attending: Cardiovascular Disease | Admitting: Cardiovascular Disease

## 2018-01-22 DIAGNOSIS — Z95820 Peripheral vascular angioplasty status with implants and grafts: Secondary | ICD-10-CM | POA: Insufficient documentation

## 2018-01-22 DIAGNOSIS — I739 Peripheral vascular disease, unspecified: Secondary | ICD-10-CM

## 2018-01-28 DIAGNOSIS — E785 Hyperlipidemia, unspecified: Secondary | ICD-10-CM | POA: Diagnosis not present

## 2018-01-28 DIAGNOSIS — Z136 Encounter for screening for cardiovascular disorders: Secondary | ICD-10-CM | POA: Diagnosis not present

## 2018-01-28 DIAGNOSIS — Z0000 Encounter for general adult medical examination without abnormal findings: Secondary | ICD-10-CM | POA: Diagnosis not present

## 2018-01-28 DIAGNOSIS — Z9181 History of falling: Secondary | ICD-10-CM | POA: Diagnosis not present

## 2018-01-29 ENCOUNTER — Other Ambulatory Visit: Payer: Self-pay | Admitting: *Deleted

## 2018-01-29 DIAGNOSIS — I739 Peripheral vascular disease, unspecified: Secondary | ICD-10-CM

## 2018-02-05 ENCOUNTER — Ambulatory Visit (INDEPENDENT_AMBULATORY_CARE_PROVIDER_SITE_OTHER): Payer: Medicare Other | Admitting: Cardiovascular Disease

## 2018-02-05 ENCOUNTER — Encounter: Payer: Self-pay | Admitting: Cardiovascular Disease

## 2018-02-05 VITALS — BP 138/72 | HR 78 | Ht 64.0 in | Wt 140.8 lb

## 2018-02-05 DIAGNOSIS — I10 Essential (primary) hypertension: Secondary | ICD-10-CM | POA: Diagnosis not present

## 2018-02-05 DIAGNOSIS — I739 Peripheral vascular disease, unspecified: Secondary | ICD-10-CM

## 2018-02-05 MED ORDER — ASPIRIN EC 81 MG PO TBEC: 81.0000 mg | DELAYED_RELEASE_TABLET | Freq: Every day | ORAL | 3 refills | Status: AC

## 2018-02-05 NOTE — Assessment & Plan Note (Signed)
History of hyperlipidemia on statin therapy with lipid profile performed 11/19/2017 revealing total cholesterol 163, LDL of 80 and HDL 48.

## 2018-02-05 NOTE — Assessment & Plan Note (Signed)
History of peripheral arterial disease status post distal abdominal aortic stenting by myself 11/15/2006 revealing a distal abdominal aortic flap which I ultimately stented 1 month later with a 4 cm x 14 mm smart nitinol self-expanding stent.  Claudication resolved and her Dopplers have remained stable over the last 11 years most recently performed 01/22/2018 with normal ABIs.

## 2018-02-05 NOTE — Patient Instructions (Signed)
Medication Instructions:  DECREASE YOUR ASPIRIN TO 81 MG ONCE DAILY  If you need a refill on your cardiac medications before your next appointment, please call your pharmacy.   Lab work: NONE If you have labs (blood work) drawn today and your tests are completely normal, you will receive your results only by: [name] MyChart Message (if you have MyChart) OR . A paper copy in the mail If you have any lab test that is abnormal or we need to change your treatment, we will call you to review the results.  Testing/Procedures: Your physician has requested that you have an abdominal aorta duplex. During this test, an ultrasound is used to evaluate the aorta. Allow 30 minutes for this exam. Do not eat after midnight the day before and avoid carbonated beverages SCHEDULE IN 1 YEAR  Follow-Up: At [facility], you and your health needs are our priority.  As part of our continuing mission to provide you with exceptional heart care, we have created designated Provider Care Teams.  These Care Teams include your primary Cardiologist (physician) and Advanced Practice Providers (APPs -  Physician Assistants and Nurse Practitioners) who all work together to provide you with the care you need, when you need it. You will need a follow up appointment in 12 months.  Please call our office 2 months in advance to schedule this appointment.  You may see Dr. Gwenlyn Found or one of the following Advanced Practice Providers on your designated Care Team:   Kerin Ransom, PA-C Roby Lofts, [place] . Sande Rives, PA-C

## 2018-02-05 NOTE — Assessment & Plan Note (Signed)
History of essential hypertension her blood pressure measured today 138/72.  She is on losartan.  Continue current meds at current dosing.

## 2018-02-05 NOTE — Progress Notes (Signed)
02/05/2018 [name] [name]   07-19-1945  [id number]  Primary Physician Philmore Pali, NP Primary Cardiologist: Lorretta Harp MD Lupe Carney, [place]  HPI:  [name] [name] is a 72 y.o.  thin-appearing, widowed Caucasian female, mother of 1 child, grandmother to 2 grandchildren who I saw  01/19/2017.[name] She has a history of discontinued tobacco abuse, having stopped on September 10, 2009. She did have 45-pack-year history prior to that. She has hypertension and hyperlipidemia as well. Because of claudication, I angiogram'd her on November 15, 2006, revealing a distal abdominal aortic flap which I ultimately stented 1 month later with a 4-x-14 SMART Nitinol self-expanding stent. Followup Dopplers were normal and her claudication resolved. Dr. Lisbeth Ply follows her lipid profile. She does get short of breath probably related to COPD. She had a Myoview stress test performed in 2008 which was normal. Dopplers performed  01/22/2018, revealed ABIs of 1 bilaterally with normal abdominal aortic waveform and a patent distal abdominal aortic stent.. Since I sawlastshe denies chest pain, shortness of breath or claudication.   Current Meds  Medication Sig  . alendronate (FOSAMAX) 70 MG tablet Take 70 mg by mouth once a week. Take with a full glass of water on an empty stomach.  . clopidogrel (PLAVIX) 75 MG tablet TAKE 1 TABLET BY MOUTH ONCE DAILY WITH BREAKFAST  . losartan (COZAAR) 25 MG tablet Take 25 mg by mouth daily.  . metFORMIN (GLUCOPHAGE-XR) 500 MG 24 hr tablet Take 500 mg by mouth daily.   . Multiple Vitamin (MULTIVITAMIN) capsule Take 1 capsule by mouth daily.  . pravastatin (PRAVACHOL) 80 MG tablet Take 80 mg by mouth daily.   [name] SPIRIVA HANDIHALER 18 MCG inhalation capsule Place 1 puff into inhaler and inhale daily as needed.  . [DISCONTINUED] aspirin 325 MG tablet Take 325 mg by mouth daily.     Allergies  Allergen Reactions  . Codeine Photosensitivity  . Crestor [Rosuvastatin] Other (See  Comments)    Muscle pain  . Lisinopril Cough    Social History   Socioeconomic History  . Marital status: Married    Spouse name: Not on file  . Number of children: 1  . Years of education: Not on file  . Highest education level: Not on file  Occupational History  . Not on file  Social Needs  . Financial resource strain: Not on file  . Food insecurity:    Worry: Not on file    Inability: Not on file  . Transportation needs:    Medical: Not on file    Non-medical: Not on file  Tobacco Use  . Smoking status: Former Smoker    Packs/day: 1.00    Years: 45.00    Pack years: 45.00    Types: Cigarettes    Last attempt to quit: 03/13/2008    Years since quitting: 9.9  . Smokeless tobacco: Former Systems developer    Quit date: 07/16/2012  Substance and Sexual Activity  . Alcohol use: No  . Drug use: No  . Sexual activity: Not on file  Lifestyle  . Physical activity:    Days per week: Not on file    Minutes per session: Not on file  . Stress: Not on file  Relationships  . Social connections:    Talks on phone: Not on file    Gets together: Not on file    Attends religious service: Not on file    Active member of club or organization: Not on file  Attends meetings of clubs or organizations: Not on file    Relationship status: Not on file  . Intimate partner violence:    Fear of current or ex partner: Not on file    Emotionally abused: Not on file    Physically abused: Not on file    Forced sexual activity: Not on file  Other Topics Concern  . Not on file  Social History Narrative  . Not on file     Review of Systems: General: negative for chills, fever, night sweats or weight changes.  Cardiovascular: negative for chest pain, dyspnea on exertion, edema, orthopnea, palpitations, paroxysmal nocturnal dyspnea or shortness of breath Dermatological: negative for rash Respiratory: negative for cough or wheezing Urologic: negative for hematuria Abdominal: negative for nausea,  vomiting, diarrhea, bright red blood per rectum, melena, or hematemesis Neurologic: negative for visual changes, syncope, or dizziness All other systems reviewed and are otherwise negative except as noted above.    Blood pressure 138/72, pulse 78, height 5\' 4"  (1.626 m), weight 140 lb 12.8 oz (63.9 kg).  General appearance: alert and no distress Neck: no adenopathy, no carotid bruit, no JVD, supple, symmetrical, trachea midline and thyroid not enlarged, symmetric, no tenderness/mass/nodules Lungs: clear to auscultation bilaterally Heart: regular rate and rhythm, S1, S2 normal, no murmur, click, rub or gallop Extremities: extremities normal, atraumatic, no cyanosis or edema Pulses: 2+ and symmetric Skin: Skin color, texture, turgor normal. No rashes or lesions Neurologic: Alert and oriented X 3, normal strength and tone. Normal symmetric reflexes. Normal coordination and gait  EKG sinus rhythm at 78 without ST or T wave changes.  Personally reviewed this EKG  ASSESSMENT AND PLAN:   Peripheral arterial disease History of peripheral arterial disease status post distal abdominal aortic stenting by myself 11/15/2006 revealing a distal abdominal aortic flap which I ultimately stented 1 month later with a 4 cm x 14 mm smart nitinol self-expanding stent.  Claudication resolved and her Dopplers have remained stable over the last 11 years most recently performed 01/22/2018 with normal ABIs.  Essential hypertension History of essential hypertension her blood pressure measured today 138/72.  She is on losartan.  Continue current meds at current dosing.  Hyperlipidemia History of hyperlipidemia on statin therapy with lipid profile performed 11/19/2017 revealing total cholesterol 163, LDL of 80 and HDL 48.      Lorretta Harp MD FACP,FACC,FAHA, [facility] 02/05/2018 11:19 AM

## 2018-02-12 DIAGNOSIS — H40003 Preglaucoma, unspecified, bilateral: Secondary | ICD-10-CM | POA: Diagnosis not present

## 2018-03-20 DIAGNOSIS — L821 Other seborrheic keratosis: Secondary | ICD-10-CM | POA: Diagnosis not present

## 2018-03-20 DIAGNOSIS — D2230 Melanocytic nevi of unspecified part of face: Secondary | ICD-10-CM | POA: Diagnosis not present

## 2018-03-20 DIAGNOSIS — D225 Melanocytic nevi of trunk: Secondary | ICD-10-CM | POA: Diagnosis not present

## 2018-03-20 DIAGNOSIS — Z1283 Encounter for screening for malignant neoplasm of skin: Secondary | ICD-10-CM | POA: Diagnosis not present

## 2018-03-20 DIAGNOSIS — D692 Other nonthrombocytopenic purpura: Secondary | ICD-10-CM | POA: Diagnosis not present

## 2018-03-20 DIAGNOSIS — L578 Other skin changes due to chronic exposure to nonionizing radiation: Secondary | ICD-10-CM | POA: Diagnosis not present

## 2018-03-20 DIAGNOSIS — L820 Inflamed seborrheic keratosis: Secondary | ICD-10-CM | POA: Diagnosis not present

## 2018-03-20 DIAGNOSIS — D229 Melanocytic nevi, unspecified: Secondary | ICD-10-CM | POA: Diagnosis not present

## 2018-03-20 DIAGNOSIS — Z85828 Personal history of other malignant neoplasm of skin: Secondary | ICD-10-CM | POA: Diagnosis not present

## 2018-03-20 DIAGNOSIS — L812 Freckles: Secondary | ICD-10-CM | POA: Diagnosis not present

## 2018-03-20 DIAGNOSIS — L720 Epidermal cyst: Secondary | ICD-10-CM | POA: Diagnosis not present

## 2018-04-30 ENCOUNTER — Other Ambulatory Visit: Payer: Self-pay | Admitting: Cardiovascular Disease

## 2018-04-30 NOTE — Telephone Encounter (Signed)
Rx request sent to pharmacy.  

## 2018-05-20 DIAGNOSIS — I10 Essential (primary) hypertension: Secondary | ICD-10-CM | POA: Diagnosis not present

## 2018-05-20 DIAGNOSIS — Z1331 Encounter for screening for depression: Secondary | ICD-10-CM | POA: Diagnosis not present

## 2018-05-20 DIAGNOSIS — E782 Mixed hyperlipidemia: Secondary | ICD-10-CM | POA: Diagnosis not present

## 2018-05-20 DIAGNOSIS — E042 Nontoxic multinodular goiter: Secondary | ICD-10-CM | POA: Diagnosis not present

## 2018-05-20 DIAGNOSIS — E119 Type 2 diabetes mellitus without complications: Secondary | ICD-10-CM | POA: Diagnosis not present

## 2018-05-20 DIAGNOSIS — Z9181 History of falling: Secondary | ICD-10-CM | POA: Diagnosis not present

## 2018-05-20 DIAGNOSIS — J439 Emphysema, unspecified: Secondary | ICD-10-CM | POA: Diagnosis not present

## 2018-07-23 ENCOUNTER — Other Ambulatory Visit (HOSPITAL_COMMUNITY): Payer: Self-pay | Admitting: Cardiovascular Disease

## 2018-07-23 DIAGNOSIS — I739 Peripheral vascular disease, unspecified: Secondary | ICD-10-CM

## 2018-08-04 ENCOUNTER — Other Ambulatory Visit: Payer: Self-pay | Admitting: Cardiovascular Disease

## 2018-08-06 NOTE — Telephone Encounter (Signed)
Rx request sent to pharmacy.  

## 2018-12-13 DIAGNOSIS — Z139 Encounter for screening, unspecified: Secondary | ICD-10-CM | POA: Diagnosis not present

## 2018-12-13 DIAGNOSIS — J439 Emphysema, unspecified: Secondary | ICD-10-CM | POA: Diagnosis not present

## 2018-12-13 DIAGNOSIS — Z9181 History of falling: Secondary | ICD-10-CM | POA: Diagnosis not present

## 2018-12-13 DIAGNOSIS — Z23 Encounter for immunization: Secondary | ICD-10-CM | POA: Diagnosis not present

## 2018-12-13 DIAGNOSIS — E782 Mixed hyperlipidemia: Secondary | ICD-10-CM | POA: Diagnosis not present

## 2018-12-13 DIAGNOSIS — E119 Type 2 diabetes mellitus without complications: Secondary | ICD-10-CM | POA: Diagnosis not present

## 2018-12-13 DIAGNOSIS — I10 Essential (primary) hypertension: Secondary | ICD-10-CM | POA: Diagnosis not present

## 2018-12-13 DIAGNOSIS — E042 Nontoxic multinodular goiter: Secondary | ICD-10-CM | POA: Diagnosis not present

## 2018-12-20 DIAGNOSIS — E119 Type 2 diabetes mellitus without complications: Secondary | ICD-10-CM | POA: Diagnosis not present

## 2019-01-08 ENCOUNTER — Telehealth: Payer: Self-pay | Admitting: *Deleted

## 2019-01-08 NOTE — Telephone Encounter (Signed)
Left message for patient to call and reschedule 01/21/19 visit with Dr. Gwenlyn Found until after 03/25/18 doppler studies

## 2019-01-21 ENCOUNTER — Ambulatory Visit: Payer: Medicare Other | Admitting: Cardiovascular Disease

## 2019-01-22 ENCOUNTER — Ambulatory Visit: Payer: Medicare Other | Admitting: Cardiovascular Disease

## 2019-01-23 ENCOUNTER — Encounter (HOSPITAL_COMMUNITY): Payer: Medicare Other

## 2019-01-24 ENCOUNTER — Ambulatory Visit (HOSPITAL_COMMUNITY)
Admission: RE | Admit: 2019-01-24 | Discharge: 2019-01-24 | Disposition: A | Discharge status: Home / Self Care | Payer: Medicare Other | Source: Ambulatory Visit | Attending: Internal Medicine | Admitting: Internal Medicine

## 2019-01-24 ENCOUNTER — Other Ambulatory Visit: Payer: Self-pay | Admitting: Cardiovascular Disease

## 2019-01-24 ENCOUNTER — Other Ambulatory Visit: Payer: Self-pay

## 2019-01-24 ENCOUNTER — Other Ambulatory Visit (HOSPITAL_COMMUNITY): Payer: Self-pay | Admitting: Cardiovascular Disease

## 2019-01-24 ENCOUNTER — Ambulatory Visit (HOSPITAL_BASED_OUTPATIENT_CLINIC_OR_DEPARTMENT_OTHER)
Admission: RE | Admit: 2019-01-24 | Discharge: 2019-01-24 | Disposition: A | Discharge status: Home / Self Care | Payer: Medicare Other | Source: Ambulatory Visit | Attending: Cardiovascular Disease | Admitting: Cardiovascular Disease

## 2019-01-24 DIAGNOSIS — I739 Peripheral vascular disease, unspecified: Secondary | ICD-10-CM

## 2019-01-27 ENCOUNTER — Other Ambulatory Visit: Payer: Self-pay | Admitting: *Deleted

## 2019-01-27 ENCOUNTER — Encounter: Payer: Self-pay | Admitting: *Deleted

## 2019-01-27 DIAGNOSIS — I739 Peripheral vascular disease, unspecified: Secondary | ICD-10-CM

## 2019-01-31 DIAGNOSIS — Z9289 Personal history of other medical treatment: Secondary | ICD-10-CM | POA: Diagnosis not present

## 2019-01-31 DIAGNOSIS — Z1231 Encounter for screening mammogram for malignant neoplasm of breast: Secondary | ICD-10-CM | POA: Diagnosis not present

## 2019-02-03 ENCOUNTER — Ambulatory Visit (INDEPENDENT_AMBULATORY_CARE_PROVIDER_SITE_OTHER): Payer: Medicare Other | Admitting: Cardiovascular Disease

## 2019-02-03 ENCOUNTER — Encounter: Payer: Self-pay | Admitting: Cardiovascular Disease

## 2019-02-03 ENCOUNTER — Other Ambulatory Visit: Payer: Self-pay

## 2019-02-03 VITALS — BP 140/72 | HR 70 | Ht 64.0 in | Wt 139.4 lb

## 2019-02-03 DIAGNOSIS — E782 Mixed hyperlipidemia: Secondary | ICD-10-CM

## 2019-02-03 DIAGNOSIS — I739 Peripheral vascular disease, unspecified: Secondary | ICD-10-CM

## 2019-02-03 DIAGNOSIS — I10 Essential (primary) hypertension: Secondary | ICD-10-CM | POA: Diagnosis not present

## 2019-02-03 MED ORDER — EZETIMIBE 10 MG PO TABS: 10.0000 mg | ORAL_TABLET | Freq: Every day | ORAL | 3 refills | Status: AC

## 2019-02-03 NOTE — Assessment & Plan Note (Signed)
History of essential hypertension with blood pressure measured today at 140/72.  She is on losartan.

## 2019-02-03 NOTE — Progress Notes (Signed)
02/03/2019 [name] [name]   June 19, 1945  [id number]  Primary Physician Philmore Pali, NP Primary Cardiologist: Lorretta Harp MD Lupe Carney, [place]  HPI:  [name] [name] is a 73 y.o.  thin-appearing, widowed Caucasian female, mother of 1 child, grandmother to 2 grandchildren who I saw  02/05/2018.[name] She has a history of discontinued tobacco abuse, having stopped on September 10, 2009. She did have 45-pack-year history prior to that. She has hypertension and hyperlipidemia as well. Because of claudication, I angiogram'd her on November 15, 2006, revealing a distal abdominal aortic flap which I ultimately stented 1 month later with a 4-x-14 SMART Nitinol self-expanding stent. Followup Dopplers were normal and her claudication resolved. Dr. Lisbeth Ply follows her lipid profile. She does get short of breath probably related to COPD. She had a Myoview stress test performed in 2008 which was normal.   Since I saw her 1 year ago she remained stable.  She denies claudication.  Recent lower extremity arterial Doppler studies performed 01/24/2019 were essentially normal.  She denies chest pain or shortness of breath.   Current Meds  Medication Sig  . alendronate (FOSAMAX) 70 MG tablet Take 70 mg by mouth once a week. Take with a full glass of water on an empty stomach.  [name] aspirin EC 81 MG tablet Take 1 tablet (81 mg total) by mouth daily.  . clopidogrel (PLAVIX) 75 MG tablet Take 1 tablet by mouth once daily  . losartan (COZAAR) 25 MG tablet Take 25 mg by mouth daily.  . metFORMIN (GLUCOPHAGE-XR) 500 MG 24 hr tablet Take 500 mg by mouth daily.   . Multiple Vitamin (MULTIVITAMIN) capsule Take 1 capsule by mouth daily.  . pravastatin (PRAVACHOL) 80 MG tablet Take 80 mg by mouth daily.   [name] SPIRIVA HANDIHALER 18 MCG inhalation capsule Place 1 puff into inhaler and inhale daily as needed.     Allergies  Allergen Reactions  . Codeine Photosensitivity  . Crestor [Rosuvastatin] Other (See Comments)   Muscle pain  . Lisinopril Cough    Social History   Socioeconomic History  . Marital status: Married    Spouse name: Not on file  . Number of children: 1  . Years of education: Not on file  . Highest education level: Not on file  Occupational History  . Not on file  Social Needs  . Financial resource strain: Not on file  . Food insecurity    Worry: Not on file    Inability: Not on file  . Transportation needs    Medical: Not on file    Non-medical: Not on file  Tobacco Use  . Smoking status: Former Smoker    Packs/day: 1.00    Years: 45.00    Pack years: 45.00    Types: Cigarettes    Quit date: 03/13/2008    Years since quitting: 10.9  . Smokeless tobacco: Former Systems developer    Quit date: 07/16/2012  Substance and Sexual Activity  . Alcohol use: No  . Drug use: No  . Sexual activity: Not on file  Lifestyle  . Physical activity    Days per week: Not on file    Minutes per session: Not on file  . Stress: Not on file  Relationships  . Social Herbalist on phone: Not on file    Gets together: Not on file    Attends religious service: Not on file    Active member of club or organization: Not  on file    Attends meetings of clubs or organizations: Not on file    Relationship status: Not on file  . Intimate partner violence    Fear of current or ex partner: Not on file    Emotionally abused: Not on file    Physically abused: Not on file    Forced sexual activity: Not on file  Other Topics Concern  . Not on file  Social History Narrative  . Not on file     Review of Systems: General: negative for chills, fever, night sweats or weight changes.  Cardiovascular: negative for chest pain, dyspnea on exertion, edema, orthopnea, palpitations, paroxysmal nocturnal dyspnea or shortness of breath Dermatological: negative for rash Respiratory: negative for cough or wheezing Urologic: negative for hematuria Abdominal: negative for nausea, vomiting, diarrhea, bright red  blood per rectum, melena, or hematemesis Neurologic: negative for visual changes, syncope, or dizziness All other systems reviewed and are otherwise negative except as noted above.    Blood pressure 140/72, pulse 70, height 5\' 4"  (1.626 m), weight 139 lb 6.4 oz (63.2 kg), SpO2 97 %.  General appearance: alert and no distress Neck: no adenopathy, no carotid bruit, no JVD, supple, symmetrical, trachea midline and thyroid not enlarged, symmetric, no tenderness/mass/nodules Lungs: clear to auscultation bilaterally Heart: regular rate and rhythm, S1, S2 normal, no murmur, click, rub or gallop Extremities: extremities normal, atraumatic, no cyanosis or edema Pulses: 2+ and symmetric Skin: Skin color, texture, turgor normal. No rashes or lesions Neurologic: Alert and oriented X 3, normal strength and tone. Normal symmetric reflexes. Normal coordination and gait  EKG sinus rhythm at 70 without ST or T wave changes.  Personally reviewed this EKG.  ASSESSMENT AND PLAN:   Peripheral arterial disease History of peripheral arterial disease status post distal abdominal aortic stenting for an abdominal aortic flap by myself 11/15/2006 with a 14 mm x 4 cm Smart nitinol self-expanding stent.  Her claudication subsequently resolved.  Her Dopplers have been normal performed every year including most recently 01/24/2019.  Essential hypertension History of essential hypertension with blood pressure measured today at 140/72.  She is on losartan.  Hyperlipidemia History of hyperlipidemia on high-dose pravastatin with lipid profile performed 12/13/2018 revealing total cholesterol 163, LDL of 96 and HDL 46.  Okay to start her on Zetia 10 mg a day and will will recheck a lipid liver profile in 2 months      Lorretta Harp MD [facility], [facility] 02/03/2019 11:46 AM

## 2019-02-03 NOTE — Assessment & Plan Note (Signed)
History of hyperlipidemia on high-dose pravastatin with lipid profile performed 12/13/2018 revealing total cholesterol 163, LDL of 96 and HDL 46.  Okay to start her on Zetia 10 mg a day and will will recheck a lipid liver profile in 2 months

## 2019-02-03 NOTE — Patient Instructions (Signed)
Medication Instructions:  Start taking 10mg  Zetia Daily.   If you need a refill on your cardiac medications before your next appointment, please call your pharmacy.   Lab work: Lipids and Hepatic Function in 2 months. If you have labs (blood work) drawn today and your tests are completely normal, you will receive your results only by: [place] (if you have MyChart) OR A paper copy in the mail If you have any lab test that is abnormal or we need to change your treatment, we will call you to review the results.  Testing/Procedures: Your physician has requested that you have a lower extremity arterial exercise duplex with ABI's in ONE YEAR. During this test, exercise and ultrasound are used to evaluate arterial blood flow in the legs. Allow one hour for this exam. There are no restrictions or special instructions.   Follow-Up: At [facility], you and your health needs are our priority.  As part of our continuing mission to provide you with exceptional heart care, we have created designated Provider Care Teams.  These Care Teams include your primary Cardiologist (physician) and Advanced Practice Providers (APPs -  Physician Assistants and Nurse Practitioners) who all work together to provide you with the care you need, when you need it. You may see Dr Gwenlyn Found or one of the following Advanced Practice Providers on your designated Care Team:    Kerin Ransom, PA-C  [place], [place]  Coletta Memos, [place]  Your physician wants you to follow-up in: [place]. You will receive a reminder letter in the mail two months in advance. If you don't receive a letter, please call our office to schedule the follow-up appointment.

## 2019-02-03 NOTE — Assessment & Plan Note (Signed)
History of peripheral arterial disease status post distal abdominal aortic stenting for an abdominal aortic flap by myself 11/15/2006 with a 14 mm x 4 cm Smart nitinol self-expanding stent.  Her claudication subsequently resolved.  Her Dopplers have been normal performed every year including most recently 01/24/2019.

## 2019-02-07 ENCOUNTER — Other Ambulatory Visit: Payer: Self-pay | Admitting: Cardiovascular Disease

## 2019-03-12 ENCOUNTER — Telehealth: Payer: Self-pay | Admitting: *Deleted

## 2019-03-12 DIAGNOSIS — Z87891 Personal history of nicotine dependence: Secondary | ICD-10-CM

## 2019-03-12 NOTE — Telephone Encounter (Signed)
Patient is rereferred, patient has been notified that annual lung cancer screening low dose CT scan is due currently or will be in near future. Confirmed that patient is within the age range of 55-77, and asymptomatic, (no signs or symptoms of lung cancer). Patient denies illness that would prevent curative treatment for lung cancer if found. Verified smoking history, (former, quit 2010, 45 pack year). The shared decision making visit was done 04/02/15. Patient is agreeable for CT scan being scheduled.

## 2019-03-24 ENCOUNTER — Other Ambulatory Visit: Payer: Self-pay

## 2019-03-24 ENCOUNTER — Ambulatory Visit
Admission: RE | Admit: 2019-03-24 | Discharge: 2019-03-24 | Disposition: A | Discharge status: Home / Self Care | Payer: Medicare Other | Source: Ambulatory Visit | Attending: Nurse Practitioner | Admitting: Nurse Practitioner

## 2019-03-24 DIAGNOSIS — Z87891 Personal history of nicotine dependence: Secondary | ICD-10-CM | POA: Diagnosis not present

## 2019-03-26 ENCOUNTER — Encounter: Payer: Self-pay | Admitting: *Deleted

## 2019-03-27 DIAGNOSIS — Z9181 History of falling: Secondary | ICD-10-CM | POA: Diagnosis not present

## 2019-03-27 DIAGNOSIS — E785 Hyperlipidemia, unspecified: Secondary | ICD-10-CM | POA: Diagnosis not present

## 2019-03-27 DIAGNOSIS — Z0000 Encounter for general adult medical examination without abnormal findings: Secondary | ICD-10-CM | POA: Diagnosis not present

## 2019-03-27 DIAGNOSIS — Z1211 Encounter for screening for malignant neoplasm of colon: Secondary | ICD-10-CM | POA: Diagnosis not present

## 2019-05-05 DIAGNOSIS — Z6822 Body mass index (BMI) 22.0-22.9, adult: Secondary | ICD-10-CM | POA: Diagnosis not present

## 2019-05-05 DIAGNOSIS — R300 Dysuria: Secondary | ICD-10-CM | POA: Diagnosis not present

## 2019-05-05 DIAGNOSIS — N390 Urinary tract infection, site not specified: Secondary | ICD-10-CM | POA: Diagnosis not present

## 2019-05-27 DIAGNOSIS — Z1211 Encounter for screening for malignant neoplasm of colon: Secondary | ICD-10-CM | POA: Diagnosis not present

## 2019-05-27 DIAGNOSIS — Z6823 Body mass index (BMI) 23.0-23.9, adult: Secondary | ICD-10-CM | POA: Diagnosis not present

## 2019-05-27 DIAGNOSIS — Z1212 Encounter for screening for malignant neoplasm of rectum: Secondary | ICD-10-CM | POA: Diagnosis not present

## 2019-05-27 DIAGNOSIS — R300 Dysuria: Secondary | ICD-10-CM | POA: Diagnosis not present

## 2019-06-01 LAB — COLOGUARD: COLOGUARD: NEGATIVE

## 2019-06-01 LAB — EXTERNAL GENERIC LAB PROCEDURE: COLOGUARD: NEGATIVE

## 2019-06-20 DIAGNOSIS — J439 Emphysema, unspecified: Secondary | ICD-10-CM | POA: Diagnosis not present

## 2019-06-20 DIAGNOSIS — I10 Essential (primary) hypertension: Secondary | ICD-10-CM | POA: Diagnosis not present

## 2019-06-20 DIAGNOSIS — M810 Age-related osteoporosis without current pathological fracture: Secondary | ICD-10-CM | POA: Diagnosis not present

## 2019-06-20 DIAGNOSIS — E119 Type 2 diabetes mellitus without complications: Secondary | ICD-10-CM | POA: Diagnosis not present

## 2019-06-20 DIAGNOSIS — E782 Mixed hyperlipidemia: Secondary | ICD-10-CM | POA: Diagnosis not present

## 2019-07-25 DIAGNOSIS — Z780 Asymptomatic menopausal state: Secondary | ICD-10-CM | POA: Diagnosis not present

## 2019-07-25 DIAGNOSIS — M810 Age-related osteoporosis without current pathological fracture: Secondary | ICD-10-CM | POA: Diagnosis not present

## 2019-07-25 DIAGNOSIS — M8588 Other specified disorders of bone density and structure, other site: Secondary | ICD-10-CM | POA: Diagnosis not present

## 2019-07-25 DIAGNOSIS — M25562 Pain in left knee: Secondary | ICD-10-CM | POA: Diagnosis not present

## 2019-10-06 ENCOUNTER — Other Ambulatory Visit: Payer: Self-pay | Admitting: Cardiovascular Disease

## 2019-11-19 DIAGNOSIS — K625 Hemorrhage of anus and rectum: Secondary | ICD-10-CM | POA: Diagnosis not present

## 2019-11-19 DIAGNOSIS — R42 Dizziness and giddiness: Secondary | ICD-10-CM | POA: Diagnosis not present

## 2019-11-19 DIAGNOSIS — Z6823 Body mass index (BMI) 23.0-23.9, adult: Secondary | ICD-10-CM | POA: Diagnosis not present

## 2019-12-26 DIAGNOSIS — E1169 Type 2 diabetes mellitus with other specified complication: Secondary | ICD-10-CM | POA: Diagnosis not present

## 2019-12-26 DIAGNOSIS — E785 Hyperlipidemia, unspecified: Secondary | ICD-10-CM | POA: Diagnosis not present

## 2019-12-26 DIAGNOSIS — Z23 Encounter for immunization: Secondary | ICD-10-CM | POA: Diagnosis not present

## 2019-12-26 DIAGNOSIS — D72829 Elevated white blood cell count, unspecified: Secondary | ICD-10-CM | POA: Diagnosis not present

## 2019-12-26 DIAGNOSIS — E782 Mixed hyperlipidemia: Secondary | ICD-10-CM | POA: Diagnosis not present

## 2019-12-26 DIAGNOSIS — I10 Essential (primary) hypertension: Secondary | ICD-10-CM | POA: Diagnosis not present

## 2019-12-26 DIAGNOSIS — Z139 Encounter for screening, unspecified: Secondary | ICD-10-CM | POA: Diagnosis not present

## 2019-12-31 DIAGNOSIS — H2513 Age-related nuclear cataract, bilateral: Secondary | ICD-10-CM | POA: Diagnosis not present

## 2020-01-26 ENCOUNTER — Ambulatory Visit (HOSPITAL_COMMUNITY): Payer: Medicare Other

## 2020-01-26 ENCOUNTER — Encounter (HOSPITAL_COMMUNITY): Payer: Medicare Other

## 2020-02-02 DIAGNOSIS — Z1231 Encounter for screening mammogram for malignant neoplasm of breast: Secondary | ICD-10-CM | POA: Diagnosis not present

## 2020-02-10 ENCOUNTER — Ambulatory Visit: Payer: Medicare Other | Admitting: Cardiovascular Disease

## 2020-02-23 ENCOUNTER — Ambulatory Visit (HOSPITAL_COMMUNITY): Payer: Medicare Other

## 2020-03-02 ENCOUNTER — Ambulatory Visit: Payer: Medicare Other | Admitting: Cardiology

## 2020-03-16 ENCOUNTER — Ambulatory Visit (HOSPITAL_COMMUNITY): Payer: Medicare Other

## 2020-03-17 ENCOUNTER — Other Ambulatory Visit: Payer: Self-pay | Admitting: *Deleted

## 2020-03-17 DIAGNOSIS — Z122 Encounter for screening for malignant neoplasm of respiratory organs: Secondary | ICD-10-CM

## 2020-03-17 DIAGNOSIS — Z87891 Personal history of nicotine dependence: Secondary | ICD-10-CM

## 2020-03-17 NOTE — Progress Notes (Signed)
Contacted and scheduled for annual lung screening scan. Patient is a former smoker with a 45 pack year history, quit 2010

## 2020-03-30 ENCOUNTER — Ambulatory Visit: Payer: Medicare Other | Admitting: Cardiology

## 2020-04-06 ENCOUNTER — Other Ambulatory Visit: Payer: Self-pay | Admitting: Cardiovascular Disease

## 2020-04-06 ENCOUNTER — Ambulatory Visit (HOSPITAL_COMMUNITY)
Admission: RE | Admit: 2020-04-06 | Discharge: 2020-04-06 | Disposition: A | Discharge status: Home / Self Care | Payer: Medicare Other | Source: Ambulatory Visit | Attending: Cardiovascular Disease | Admitting: Cardiovascular Disease

## 2020-04-06 ENCOUNTER — Ambulatory Visit (HOSPITAL_BASED_OUTPATIENT_CLINIC_OR_DEPARTMENT_OTHER)
Admission: RE | Admit: 2020-04-06 | Discharge: 2020-04-06 | Disposition: A | Discharge status: Home / Self Care | Payer: Medicare Other | Source: Ambulatory Visit | Attending: Cardiovascular Disease | Admitting: Cardiovascular Disease

## 2020-04-06 ENCOUNTER — Other Ambulatory Visit: Payer: Self-pay

## 2020-04-06 DIAGNOSIS — Z95828 Presence of other vascular implants and grafts: Secondary | ICD-10-CM

## 2020-04-06 DIAGNOSIS — I10 Essential (primary) hypertension: Secondary | ICD-10-CM | POA: Insufficient documentation

## 2020-04-06 DIAGNOSIS — I739 Peripheral vascular disease, unspecified: Secondary | ICD-10-CM

## 2020-04-13 ENCOUNTER — Other Ambulatory Visit: Payer: Self-pay

## 2020-04-13 ENCOUNTER — Ambulatory Visit
Admission: RE | Admit: 2020-04-13 | Discharge: 2020-04-13 | Disposition: A | Discharge status: Home / Self Care | Payer: Medicare Other | Source: Ambulatory Visit | Attending: Nurse Practitioner | Admitting: Nurse Practitioner

## 2020-04-13 DIAGNOSIS — Z87891 Personal history of nicotine dependence: Secondary | ICD-10-CM | POA: Diagnosis not present

## 2020-04-13 DIAGNOSIS — Z122 Encounter for screening for malignant neoplasm of respiratory organs: Secondary | ICD-10-CM | POA: Diagnosis not present

## 2020-04-15 ENCOUNTER — Encounter: Payer: Self-pay | Admitting: *Deleted

## 2020-04-19 ENCOUNTER — Ambulatory Visit: Payer: Medicare Other

## 2020-04-26 ENCOUNTER — Encounter: Payer: Self-pay | Admitting: Cardiology

## 2020-04-26 ENCOUNTER — Other Ambulatory Visit: Payer: Self-pay

## 2020-04-26 ENCOUNTER — Ambulatory Visit (INDEPENDENT_AMBULATORY_CARE_PROVIDER_SITE_OTHER): Payer: Medicare Other | Admitting: Cardiology

## 2020-04-26 ENCOUNTER — Encounter: Payer: Self-pay | Admitting: Cardiovascular Disease

## 2020-04-26 VITALS — BP 148/84 | HR 68 | Ht 64.0 in | Wt 141.8 lb

## 2020-04-26 DIAGNOSIS — I10 Essential (primary) hypertension: Secondary | ICD-10-CM

## 2020-04-26 DIAGNOSIS — R0600 Dyspnea, unspecified: Secondary | ICD-10-CM | POA: Insufficient documentation

## 2020-04-26 DIAGNOSIS — R0609 Other forms of dyspnea: Secondary | ICD-10-CM | POA: Insufficient documentation

## 2020-04-26 DIAGNOSIS — I739 Peripheral vascular disease, unspecified: Secondary | ICD-10-CM

## 2020-04-26 DIAGNOSIS — I2510 Atherosclerotic heart disease of native coronary artery without angina pectoris: Secondary | ICD-10-CM | POA: Diagnosis not present

## 2020-04-26 DIAGNOSIS — E782 Mixed hyperlipidemia: Secondary | ICD-10-CM | POA: Diagnosis not present

## 2020-04-26 NOTE — Assessment & Plan Note (Signed)
Controlled.  

## 2020-04-26 NOTE — Progress Notes (Signed)
Cardiology Office Note:    Date:  04/26/2020   ID:  [name] [name], DOB 08-Dec-1945, MRN [id number]  PCP:  Leonides Sake, MD  Cardiologist:  Dr Gwenlyn Found Electrophysiologist:  None   Referring MD: Lorretta Harp, MD   CC: DOE  History of Present Illness:    [name] [name] is a pleasnt 75 y.o. female with a hx of emphysema, hypertension, dyslipidemia, and PVD. She had an aortic flap PTA in September 2008 by Dr. Gwenlyn Found. She had a remote Lexiscan in 2008 that was low risk. She presents to the office today for routine follow-up. The patient had her annual high resolution CT scan for COPD and history of smoking. This was negative for lung cancer but did show coronary artery calcifications. She is concerned about this finding. She admits to dyspnea on exertion, hard to tell if this could be an anginal equivalent versus emphysema.  Past Medical History:  Diagnosis Date  . COPD (chronic obstructive pulmonary disease) ([place])   . Diabetes mellitus without complication ([place])   . Emphysema of lung ([place])   . Hyperlipidemia   . Hypertension   . Personal history of tobacco use, presenting hazards to health 04/01/2015  . PVD (peripheral vascular disease) ([place])    abdominal aorta stent    Past Surgical History:  Procedure Laterality Date  . ABDOMINAL AORTA STENT  12/19/2006   PTA & stent with 4x14 nitinol self-expanding stent with IBIS guidance - diagnostic cath 11/15/2006 (Dr. Adora Fridge)  . ABDOMINAL HYSTERECTOMY  1980   Total  . [place]  . NM MYOCAR PERF WALL MOTION  10/2006   persantine - EF 77%, breast attenuation, low risk, normal perfusion    Current Medications: Current Meds  Medication Sig  . aspirin EC 81 MG tablet Take 1 tablet (81 mg total) by mouth daily.  . clopidogrel (PLAVIX) 75 MG tablet Take 1 tablet by mouth once daily  . ezetimibe (ZETIA) 10 MG tablet Take 1 tablet (10 mg total) by mouth daily.  [name] losartan (COZAAR) 50 MG tablet Take 50 mg by mouth daily.  .  metFORMIN (GLUCOPHAGE-XR) 500 MG 24 hr tablet Take 500 mg by mouth daily.   . Multiple Vitamin (MULTIVITAMIN) capsule Take 1 capsule by mouth daily.  . pravastatin (PRAVACHOL) 80 MG tablet Take 80 mg by mouth daily.   [name] SPIRIVA HANDIHALER 18 MCG inhalation capsule Place 1 puff into inhaler and inhale daily as needed.  . [DISCONTINUED] losartan (COZAAR) 25 MG tablet Take 25 mg by mouth daily.     Allergies:   Codeine, Crestor [rosuvastatin], and Lisinopril   Social History   Socioeconomic History  . Marital status: Married    Spouse name: Not on file  . Number of children: 1  . Years of education: Not on file  . Highest education level: Not on file  Occupational History  . Not on file  Tobacco Use  . Smoking status: Former Smoker    Packs/day: 1.00    Years: 45.00    Pack years: 45.00    Types: Cigarettes    Quit date: 03/13/2008    Years since quitting: 12.1  . Smokeless tobacco: Former Systems developer    Quit date: 07/16/2012  Vaping Use  . Vaping Use: Never used  Substance and Sexual Activity  . Alcohol use: No  . Drug use: No  . Sexual activity: Not on file  Other Topics Concern  . Not on file  Social History Narrative  .  Not on file   Social Determinants of Health   Financial Resource Strain: Not on file  Food Insecurity: Not on file  Transportation Needs: Not on file  Physical Activity: Not on file  Stress: Not on file  Social Connections: Not on file     Family History: The patient's family history includes Cancer in her maternal aunt and maternal grandmother; Heart attack in her brother; Heart disease in her brother; Lung disease in her father; Stroke in her mother.  ROS:   Please see the history of present illness.     All other systems reviewed and are negative.  EKGs/Labs/Other Studies Reviewed:    The following studies were reviewed today: CT scan 04/13/2020- IMPRESSION: 1. Lung-RADS 2, benign appearance or behavior. Continue annual screening with low-dose  chest CT without contrast in 12 months. 2. Diffuse bronchial wall thickening with emphysema, as above; imaging findings suggestive of underlying COPD. 3. Coronary artery calcifications. 4. Aortic Atherosclerosis (ICD10-I70.0) and Emphysema (ICD10-J43.9). 5. Right adrenal gland myelolipoma.  Stable.  EKG:  EKG is ordered today.  The ekg ordered today demonstrates NSR, HR 68  Recent Labs: No results found for requested labs within last 8760 hours.  Recent Lipid Panel No results found for: CHOL, TRIG, HDL, CHOLHDL, VLDL, LDLCALC, LDLDIRECT  Physical Exam:    VS:  BP (!) 148/84   Pulse 68   Ht 5\' 4"  (1.626 m)   Wt 141 lb 12.8 oz (64.3 kg)   BMI 24.34 kg/m     Wt Readings from Last 3 Encounters:  04/26/20 141 lb 12.8 oz (64.3 kg)  04/13/20 134 lb (60.8 kg)  03/24/19 134 lb (60.8 kg)     GEN:  Well nourished, well developed in no acute distress HEENT: Normal NECK: No JVD;  LYMPHATICS: No lymphadenopathy CARDIAC: RRR, no murmurs, rubs, gallops RESPIRATORY:  Decreased breath sounds c/w COPD- no wheezing ABDOMEN: Soft, non-distended MUSCULOSKELETAL:  No edema; No deformity  SKIN: Warm and dry NEUROLOGIC:  Alert and oriented x 3 PSYCHIATRIC:  Normal affect   ASSESSMENT:    DOE (dyspnea on exertion) R/O anginal equivalent   CAD (coronary artery disease) Coronary Ca++ on CT scan  Essential hypertension Controlled  Hyperlipidemia Followed by PCP- she is on Pravachol and Zetia  Peripheral arterial disease ([place]) Aortic flap PTA 2008-she is on chronic aspirin and Plavix  PLAN:    Check Lexiscan Myoview- f/u Dr Gwenlyn Found   Medication Adjustments/Labs and Tests Ordered: Current medicines are reviewed at length with the patient today.  Concerns regarding medicines are outlined above.  Orders Placed This Encounter  Procedures  . MYOCARDIAL PERFUSION IMAGING  . EKG 12-Lead   No orders of the defined types were placed in this encounter.   Patient Instructions   Medication Instructions:  Continue current medications  *If you need a refill on your cardiac medications before your next appointment, please call your pharmacy*   Lab Work: None Ordered   Testing/Procedures: Your physician has requested that you have a lexiscan myoview. For further information please visit [email or web address]. Please follow instruction sheet, as given.  Follow-Up: At [facility], you and your health needs are our priority.  As part of our continuing mission to provide you with exceptional heart care, we have created designated Provider Care Teams.  These Care Teams include your primary Cardiologist (physician) and Advanced Practice Providers (APPs -  Physician Assistants and Nurse Practitioners) who all work together to provide you with the care you need, when you need it.  We recommend signing up for the patient portal called "MyChart".  Sign up information is provided on this After Visit Summary.  MyChart is used to connect with patients for Virtual Visits (Telemedicine).  Patients are able to view lab/test results, encounter notes, upcoming appointments, etc.  Non-urgent messages can be sent to your provider as well.   To learn more about what you can do with MyChart, go to [email or web address].    Your next appointment:   3-4 month(s)  The format for your next appointment:   In Person  Provider:   You may see Quay Burow, MD or one of the following Advanced Practice Providers on your designated Care Team:    Kerin Ransom, PA-C  [place], [place]  Coletta Memos, FNP        Signed, Kerin Ransom, [place]  04/26/2020 10:44 AM    [place]

## 2020-04-26 NOTE — Patient Instructions (Signed)
Medication Instructions:  Continue current medications  *If you need a refill on your cardiac medications before your next appointment, please call your pharmacy*   Lab Work: None Ordered   Testing/Procedures: Your physician has requested that you have a lexiscan myoview. For further information please visit [email or web address]. Please follow instruction sheet, as given.  Follow-Up: At [facility], you and your health needs are our priority.  As part of our continuing mission to provide you with exceptional heart care, we have created designated Provider Care Teams.  These Care Teams include your primary Cardiologist (physician) and Advanced Practice Providers (APPs -  Physician Assistants and Nurse Practitioners) who all work together to provide you with the care you need, when you need it.  We recommend signing up for the patient portal called "MyChart".  Sign up information is provided on this After Visit Summary.  MyChart is used to connect with patients for Virtual Visits (Telemedicine).  Patients are able to view lab/test results, encounter notes, upcoming appointments, etc.  Non-urgent messages can be sent to your provider as well.   To learn more about what you can do with MyChart, go to [email or web address].    Your next appointment:   3-4 month(s)  The format for your next appointment:   In Person  Provider:   You may see Quay Burow, MD or one of the following Advanced Practice Providers on your designated Care Team:    Kerin Ransom, PA-C  [place], [place]  Coletta Memos, [place]

## 2020-04-26 NOTE — Assessment & Plan Note (Signed)
R/O anginal equivalent 

## 2020-04-26 NOTE — Assessment & Plan Note (Addendum)
Aortic flap PTA 2008-she is on chronic aspirin and Plavix

## 2020-04-26 NOTE — Assessment & Plan Note (Signed)
Followed by PCP- she is on Pravachol and Zetia

## 2020-04-26 NOTE — Assessment & Plan Note (Signed)
Coronary Ca++ on CT scan

## 2020-04-30 ENCOUNTER — Telehealth (HOSPITAL_COMMUNITY): Payer: Self-pay | Admitting: *Deleted

## 2020-04-30 NOTE — Telephone Encounter (Signed)
Close encounter 

## 2020-05-05 ENCOUNTER — Other Ambulatory Visit: Payer: Self-pay

## 2020-05-05 ENCOUNTER — Ambulatory Visit (HOSPITAL_COMMUNITY)
Admission: RE | Admit: 2020-05-05 | Discharge: 2020-05-05 | Disposition: A | Discharge status: Home / Self Care | Payer: Medicare Other | Source: Ambulatory Visit | Attending: Cardiovascular Disease | Admitting: Cardiovascular Disease

## 2020-05-05 DIAGNOSIS — I2510 Atherosclerotic heart disease of native coronary artery without angina pectoris: Secondary | ICD-10-CM | POA: Diagnosis not present

## 2020-05-05 DIAGNOSIS — R0600 Dyspnea, unspecified: Secondary | ICD-10-CM | POA: Insufficient documentation

## 2020-05-05 DIAGNOSIS — R0609 Other forms of dyspnea: Secondary | ICD-10-CM

## 2020-05-05 LAB — MYOCARDIAL PERFUSION IMAGING
LV dias vol: 72 mL (ref 46–106)
LV sys vol: 25 mL
Peak HR: 98 {beats}/min
Rest HR: 58 {beats}/min
SDS: 2
SRS: 0
SSS: 2
TID: 1.05

## 2020-05-05 MED ORDER — TECHNETIUM TC 99M TETROFOSMIN IV KIT
31.0000 | PACK | Freq: Once | INTRAVENOUS | Status: AC | PRN
  Administered 2020-05-05: 31 via INTRAVENOUS
  Filled 2020-05-05: qty 31

## 2020-05-05 MED ORDER — REGADENOSON 0.4 MG/5ML IV SOLN
0.4000 mg | Freq: Once | INTRAVENOUS | Status: AC
  Administered 2020-05-05: 0.4 mg via INTRAVENOUS

## 2020-05-05 MED ORDER — TECHNETIUM TC 99M TETROFOSMIN IV KIT
9.9000 | PACK | Freq: Once | INTRAVENOUS | Status: AC | PRN
  Administered 2020-05-05: 9.9 via INTRAVENOUS
  Filled 2020-05-05: qty 10

## 2020-05-10 DIAGNOSIS — Z9181 History of falling: Secondary | ICD-10-CM | POA: Diagnosis not present

## 2020-05-10 DIAGNOSIS — L570 Actinic keratosis: Secondary | ICD-10-CM | POA: Diagnosis not present

## 2020-07-02 DIAGNOSIS — J449 Chronic obstructive pulmonary disease, unspecified: Secondary | ICD-10-CM | POA: Diagnosis not present

## 2020-07-02 DIAGNOSIS — E782 Mixed hyperlipidemia: Secondary | ICD-10-CM | POA: Diagnosis not present

## 2020-07-02 DIAGNOSIS — E1169 Type 2 diabetes mellitus with other specified complication: Secondary | ICD-10-CM | POA: Diagnosis not present

## 2020-07-02 DIAGNOSIS — M79675 Pain in left toe(s): Secondary | ICD-10-CM | POA: Diagnosis not present

## 2020-07-02 DIAGNOSIS — Z6824 Body mass index (BMI) 24.0-24.9, adult: Secondary | ICD-10-CM | POA: Diagnosis not present

## 2020-07-02 DIAGNOSIS — E785 Hyperlipidemia, unspecified: Secondary | ICD-10-CM | POA: Diagnosis not present

## 2020-07-02 DIAGNOSIS — I739 Peripheral vascular disease, unspecified: Secondary | ICD-10-CM | POA: Diagnosis not present

## 2020-07-02 DIAGNOSIS — I10 Essential (primary) hypertension: Secondary | ICD-10-CM | POA: Diagnosis not present

## 2020-07-12 DIAGNOSIS — D485 Neoplasm of uncertain behavior of skin: Secondary | ICD-10-CM | POA: Diagnosis not present

## 2020-07-27 ENCOUNTER — Ambulatory Visit: Payer: Medicare Other | Admitting: Cardiovascular Disease

## 2020-08-26 DIAGNOSIS — Z0000 Encounter for general adult medical examination without abnormal findings: Secondary | ICD-10-CM | POA: Diagnosis not present

## 2020-08-26 DIAGNOSIS — E785 Hyperlipidemia, unspecified: Secondary | ICD-10-CM | POA: Diagnosis not present

## 2020-08-26 DIAGNOSIS — Z9181 History of falling: Secondary | ICD-10-CM | POA: Diagnosis not present

## 2020-09-23 DIAGNOSIS — D485 Neoplasm of uncertain behavior of skin: Secondary | ICD-10-CM | POA: Diagnosis not present

## 2020-10-07 DIAGNOSIS — L720 Epidermal cyst: Secondary | ICD-10-CM | POA: Diagnosis not present

## 2020-10-07 DIAGNOSIS — D485 Neoplasm of uncertain behavior of skin: Secondary | ICD-10-CM | POA: Diagnosis not present

## 2020-12-31 DIAGNOSIS — I10 Essential (primary) hypertension: Secondary | ICD-10-CM | POA: Diagnosis not present

## 2020-12-31 DIAGNOSIS — Z79899 Other long term (current) drug therapy: Secondary | ICD-10-CM | POA: Diagnosis not present

## 2020-12-31 DIAGNOSIS — I700 Atherosclerosis of aorta: Secondary | ICD-10-CM | POA: Diagnosis not present

## 2020-12-31 DIAGNOSIS — Z23 Encounter for immunization: Secondary | ICD-10-CM | POA: Diagnosis not present

## 2020-12-31 DIAGNOSIS — Z139 Encounter for screening, unspecified: Secondary | ICD-10-CM | POA: Diagnosis not present

## 2020-12-31 DIAGNOSIS — J439 Emphysema, unspecified: Secondary | ICD-10-CM | POA: Diagnosis not present

## 2020-12-31 DIAGNOSIS — E782 Mixed hyperlipidemia: Secondary | ICD-10-CM | POA: Diagnosis not present

## 2020-12-31 DIAGNOSIS — R809 Proteinuria, unspecified: Secondary | ICD-10-CM | POA: Diagnosis not present

## 2020-12-31 DIAGNOSIS — E1129 Type 2 diabetes mellitus with other diabetic kidney complication: Secondary | ICD-10-CM | POA: Diagnosis not present

## 2021-01-31 DIAGNOSIS — E119 Type 2 diabetes mellitus without complications: Secondary | ICD-10-CM | POA: Diagnosis not present

## 2021-02-11 DIAGNOSIS — Z1231 Encounter for screening mammogram for malignant neoplasm of breast: Secondary | ICD-10-CM | POA: Diagnosis not present

## 2021-02-25 ENCOUNTER — Other Ambulatory Visit: Payer: Self-pay

## 2021-02-25 DIAGNOSIS — I739 Peripheral vascular disease, unspecified: Secondary | ICD-10-CM

## 2021-05-20 ENCOUNTER — Ambulatory Visit (HOSPITAL_COMMUNITY)
Admission: RE | Admit: 2021-05-20 | Discharge: 2021-05-20 | Disposition: A | Discharge status: Home / Self Care | Payer: Medicare Other | Source: Ambulatory Visit | Attending: Cardiovascular Disease | Admitting: Cardiovascular Disease

## 2021-05-20 ENCOUNTER — Other Ambulatory Visit: Payer: Self-pay

## 2021-05-20 ENCOUNTER — Ambulatory Visit (HOSPITAL_BASED_OUTPATIENT_CLINIC_OR_DEPARTMENT_OTHER)
Admission: RE | Admit: 2021-05-20 | Discharge: 2021-05-20 | Disposition: A | Discharge status: Home / Self Care | Payer: Medicare Other | Source: Ambulatory Visit | Attending: Cardiovascular Disease | Admitting: Cardiovascular Disease

## 2021-05-20 DIAGNOSIS — Z95828 Presence of other vascular implants and grafts: Secondary | ICD-10-CM | POA: Diagnosis not present

## 2021-05-20 DIAGNOSIS — I739 Peripheral vascular disease, unspecified: Secondary | ICD-10-CM | POA: Insufficient documentation

## 2021-05-27 ENCOUNTER — Other Ambulatory Visit: Payer: Self-pay

## 2021-05-27 ENCOUNTER — Encounter: Payer: Self-pay | Admitting: Cardiovascular Disease

## 2021-05-27 ENCOUNTER — Ambulatory Visit: Payer: Medicare Other | Admitting: Cardiovascular Disease

## 2021-05-27 VITALS — BP 138/76 | HR 75 | Ht 64.0 in | Wt 147.8 lb

## 2021-05-27 DIAGNOSIS — R0609 Other forms of dyspnea: Secondary | ICD-10-CM

## 2021-05-27 DIAGNOSIS — N390 Urinary tract infection, site not specified: Secondary | ICD-10-CM | POA: Diagnosis not present

## 2021-05-27 DIAGNOSIS — E782 Mixed hyperlipidemia: Secondary | ICD-10-CM | POA: Diagnosis not present

## 2021-05-27 DIAGNOSIS — I739 Peripheral vascular disease, unspecified: Secondary | ICD-10-CM | POA: Diagnosis not present

## 2021-05-27 DIAGNOSIS — Z6824 Body mass index (BMI) 24.0-24.9, adult: Secondary | ICD-10-CM | POA: Diagnosis not present

## 2021-05-27 DIAGNOSIS — R0989 Other specified symptoms and signs involving the circulatory and respiratory systems: Secondary | ICD-10-CM | POA: Diagnosis not present

## 2021-05-27 DIAGNOSIS — I2510 Atherosclerotic heart disease of native coronary artery without angina pectoris: Secondary | ICD-10-CM

## 2021-05-27 DIAGNOSIS — I10 Essential (primary) hypertension: Secondary | ICD-10-CM

## 2021-05-27 NOTE — Assessment & Plan Note (Signed)
History of essential hypertension a blood pressure measured today at 138/76.  She is on losartan. ?

## 2021-05-27 NOTE — Progress Notes (Signed)
? ? ? ?05/27/2021 ?[name] [name]   ?[birth year]  ?[id number] ? ?Primary Physician [name], [name] Mercy, MD ?Primary Cardiologist: Lorretta Harp MD [name] [name], [place] ? ?HPI:  [name] [name] is a 76 y.o.  thin-appearing, widowed Caucasian female, mother of 1 child, grandmother to 2 grandchildren who I saw  02/03/2019.[name] She has a history of discontinued tobacco abuse, having stopped on September 10, 2009. She did have 45-pack-year history prior to that. She has hypertension and hyperlipidemia as well. Because of claudication, I angiogram'd her on November 15, 2006, revealing a distal abdominal aortic flap which I ultimately stented 1 month later with a 4-x-14 SMART Nitinol self-expanding stent. Followup Dopplers were normal and her claudication resolved. Dr. Lisbeth Ply follows her lipid profile. She does get short of breath probably related to COPD. She had a Myoview stress test performed in 2008 which was normal.  ?  ?Since I saw her 2-1/2 years ago she did see [name] [name], [place] in February 2022.  Chest CT done at that time for cancer screening did mention coronary calcification and as a result she had a Myoview stress test performed 05/05/2020 that was low risk and nonischemic.  She does complain of shortness of breath most likely related to COPD but denies chest pain.. ? ? ?Current Meds  ?Medication Sig  ? aspirin EC 81 MG tablet Take 1 tablet (81 mg total) by mouth daily.  ? clopidogrel (PLAVIX) 75 MG tablet Take 1 tablet by mouth once daily  ? losartan (COZAAR) 50 MG tablet Take 50 mg by mouth daily.  ? metFORMIN (GLUCOPHAGE-XR) 500 MG 24 hr tablet Take 500 mg by mouth daily.   ? Multiple Vitamin (MULTIVITAMIN) capsule Take 1 capsule by mouth daily.  ? pravastatin (PRAVACHOL) 80 MG tablet Take 80 mg by mouth daily.   ? SPIRIVA HANDIHALER 18 MCG inhalation capsule Place 1 puff into inhaler and inhale daily as needed.  ?  ? ?Allergies  ?Allergen Reactions  ? Codeine Photosensitivity  ? Crestor [Rosuvastatin] Other (See  Comments)  ?  Muscle pain  ? Lisinopril Cough  ? ? ?Social History  ? ?Socioeconomic History  ? Marital status: Married  ?  Spouse name: Not on file  ? Number of children: 1  ? Years of education: Not on file  ? Highest education level: Not on file  ?Occupational History  ? Not on file  ?Tobacco Use  ? Smoking status: Former  ?  Packs/day: 1.00  ?  Years: 45.00  ?  Pack years: 45.00  ?  Types: Cigarettes  ?  Quit date: 03/13/2008  ?  Years since quitting: 13.2  ? Smokeless tobacco: Former  ?  Quit date: 07/16/2012  ?Vaping Use  ? Vaping Use: Never used  ?Substance and Sexual Activity  ? Alcohol use: No  ? Drug use: No  ? Sexual activity: Not on file  ?Other Topics Concern  ? Not on file  ?Social History Narrative  ? Not on file  ? ?Social Determinants of Health  ? ?Financial Resource Strain: Not on file  ?Food Insecurity: Not on file  ?Transportation Needs: Not on file  ?Physical Activity: Not on file  ?Stress: Not on file  ?Social Connections: Not on file  ?Intimate Partner Violence: Not on file  ?  ? ?Review of Systems: ?General: negative for chills, fever, night sweats or weight changes.  ?Cardiovascular: negative for chest pain, dyspnea on exertion, edema, orthopnea, palpitations, paroxysmal nocturnal dyspnea or shortness of breath ?Dermatological:  negative for rash ?Respiratory: negative for cough or wheezing ?Urologic: negative for hematuria ?Abdominal: negative for nausea, vomiting, diarrhea, bright red blood per rectum, melena, or hematemesis ?Neurologic: negative for visual changes, syncope, or dizziness ?All other systems reviewed and are otherwise negative except as noted above. ? ? ? ?Blood pressure 138/76, pulse 75, height '5\' 4"'$  (1.626 m), weight 147 lb 12.8 oz (67 kg), SpO2 96 %.  ?General appearance: alert and no distress ?Neck: no adenopathy, no JVD, supple, symmetrical, trachea midline, thyroid not enlarged, symmetric, no tenderness/mass/nodules, and soft left carotid bruit ?Lungs: clear to auscultation  bilaterally ?Heart: regular rate and rhythm, S1, S2 normal, no murmur, click, rub or gallop ?Extremities: extremities normal, atraumatic, no cyanosis or edema ?Pulses: 2+ and symmetric ?Skin: Skin color, texture, turgor normal. No rashes or lesions ?Neurologic: Grossly normal ? ?EKG sinus rhythm at 75 without ST or T wave changes.  Personally reviewed this EKG. ? ?ASSESSMENT AND PLAN:  ? ?Peripheral arterial disease ([place]) ?History 3 of peripheral arterial disease status post peripheral angiography performed by myself 9/4/await revealing a distal abdominal aortic flap which I ultimately stented 1 month later with a 4 cm x 14 mm Smart self-expanding stent.  Her most recent lower extremity arterial Doppler studies performed 05/20/2021 revealed normal ABIs bilaterally with normal velocities within her aorta and iliac arteries suggesting patency of her aortic stent. ? ?Essential hypertension ?History of essential hypertension a blood pressure measured today at 138/76.  She is on losartan. ? ?Hyperlipidemia ?History of hyperlipidemia on pravastatin and Zetia with lipid profile performed 12/31/2020 revealing total cholesterol 151, LDL 76 and HDL 45. ? ?DOE (dyspnea on exertion) ?History of dyspnea on exertion attributed to COPD.  She did stop smoking 09/10/2009 after smoking 45 pack years. ? ?CAD (coronary artery disease) ?History of coronary calcification seen on chest CT work cancer screening with a subsequent Myoview stress test performed 05/05/2020 that was low risk and nonischemic. ? ? ? ? ?Lorretta Harp MD [facility], FSCAI ?05/27/2021 ?11:06 AM ?

## 2021-05-27 NOTE — Assessment & Plan Note (Signed)
History of coronary calcification seen on chest CT work cancer screening with a subsequent Myoview stress test performed 05/05/2020 that was low risk and nonischemic. ?

## 2021-05-27 NOTE — Assessment & Plan Note (Signed)
History 3 of peripheral arterial disease status post peripheral angiography performed by myself 9/4/await revealing a distal abdominal aortic flap which I ultimately stented 1 month later with a 4 cm x 14 mm Smart self-expanding stent.  Her most recent lower extremity arterial Doppler studies performed 05/20/2021 revealed normal ABIs bilaterally with normal velocities within her aorta and iliac arteries suggesting patency of her aortic stent. ?

## 2021-05-27 NOTE — Assessment & Plan Note (Signed)
History of dyspnea on exertion attributed to COPD.  She did stop smoking 09/10/2009 after smoking 45 pack years. ?

## 2021-05-27 NOTE — Assessment & Plan Note (Signed)
History of hyperlipidemia on pravastatin and Zetia with lipid profile performed 12/31/2020 revealing total cholesterol 151, LDL 76 and HDL 45. ?

## 2021-05-27 NOTE — Patient Instructions (Signed)
Medication Instructions:  ?No changes ?*If you need a refill on your cardiac medications before your next appointment, please call your pharmacy* ? ? ?Lab Work: ?None ordered ?If you have labs (blood work) drawn today and your tests are completely normal, you will receive your results only by: ?MyChart Message (if you have MyChart) OR ?A paper copy in the mail ?If you have any lab test that is abnormal or we need to change your treatment, we will call you to review the results. ? ? ?Testing/Procedures: ?Your physician has requested that you have a carotid duplex. This test is an ultrasound of the carotid arteries in your neck. It looks at blood flow through these arteries that supply the brain with blood. Allow one hour for this exam. There are no restrictions or special instructions. This will take place at [address], [address]. ? ?Follow-Up: ?At [facility], you and your health needs are our priority.  As part of our continuing mission to provide you with exceptional heart care, we have created designated Provider Care Teams.  These Care Teams include your primary Cardiologist (physician) and Advanced Practice Providers (APPs -  Physician Assistants and Nurse Practitioners) who all work together to provide you with the care you need, when you need it. ? ?We recommend signing up for the patient portal called "MyChart".  Sign up information is provided on this After Visit Summary.  MyChart is used to connect with patients for Virtual Visits (Telemedicine).  Patients are able to view lab/test results, encounter notes, upcoming appointments, etc.  Non-urgent messages can be sent to your provider as well.   ?To learn more about what you can do with MyChart, go to [email or web address].   ? ?Your next appointment:   ?12 month(s) ? ?The format for your next appointment:   ?In Person ? ?Provider:   ?Dr. Gwenlyn Found ?

## 2021-06-03 ENCOUNTER — Ambulatory Visit (HOSPITAL_COMMUNITY)
Admission: RE | Admit: 2021-06-03 | Discharge: 2021-06-03 | Disposition: A | Discharge status: Home / Self Care | Payer: Medicare Other | Source: Ambulatory Visit | Attending: Cardiovascular Disease | Admitting: Cardiovascular Disease

## 2021-06-03 ENCOUNTER — Other Ambulatory Visit: Payer: Self-pay

## 2021-06-03 DIAGNOSIS — R0989 Other specified symptoms and signs involving the circulatory and respiratory systems: Secondary | ICD-10-CM | POA: Insufficient documentation

## 2021-06-15 ENCOUNTER — Telehealth: Payer: Self-pay | Admitting: Acute Care

## 2021-06-15 NOTE — Telephone Encounter (Signed)
Attempted to reach pt.  She answered and said hello, nurse attempted to speak and she says do you know what we say now-good bye with call disconnected. ?

## 2021-07-01 DIAGNOSIS — E782 Mixed hyperlipidemia: Secondary | ICD-10-CM | POA: Diagnosis not present

## 2021-07-01 DIAGNOSIS — I10 Essential (primary) hypertension: Secondary | ICD-10-CM | POA: Diagnosis not present

## 2021-07-01 DIAGNOSIS — J439 Emphysema, unspecified: Secondary | ICD-10-CM | POA: Diagnosis not present

## 2021-07-01 DIAGNOSIS — Z87891 Personal history of nicotine dependence: Secondary | ICD-10-CM | POA: Diagnosis not present

## 2021-07-01 DIAGNOSIS — M7712 Lateral epicondylitis, left elbow: Secondary | ICD-10-CM | POA: Diagnosis not present

## 2021-07-01 DIAGNOSIS — E1129 Type 2 diabetes mellitus with other diabetic kidney complication: Secondary | ICD-10-CM | POA: Diagnosis not present

## 2021-07-01 DIAGNOSIS — I739 Peripheral vascular disease, unspecified: Secondary | ICD-10-CM | POA: Diagnosis not present

## 2021-07-01 DIAGNOSIS — R809 Proteinuria, unspecified: Secondary | ICD-10-CM | POA: Diagnosis not present

## 2021-07-01 DIAGNOSIS — I700 Atherosclerosis of aorta: Secondary | ICD-10-CM | POA: Diagnosis not present

## 2021-07-02 ENCOUNTER — Other Ambulatory Visit: Payer: Self-pay | Admitting: Family Medicine

## 2021-07-02 DIAGNOSIS — Z87891 Personal history of nicotine dependence: Secondary | ICD-10-CM

## 2021-07-25 DIAGNOSIS — E871 Hypo-osmolality and hyponatremia: Secondary | ICD-10-CM | POA: Diagnosis not present

## 2021-08-03 ENCOUNTER — Ambulatory Visit: Admission: RE | Admit: 2021-08-03 | Payer: Medicare Other | Source: Ambulatory Visit

## 2021-08-29 DIAGNOSIS — Z9181 History of falling: Secondary | ICD-10-CM | POA: Diagnosis not present

## 2021-08-29 DIAGNOSIS — Z0000 Encounter for general adult medical examination without abnormal findings: Secondary | ICD-10-CM | POA: Diagnosis not present

## 2021-08-29 DIAGNOSIS — E785 Hyperlipidemia, unspecified: Secondary | ICD-10-CM | POA: Diagnosis not present

## 2022-01-06 DIAGNOSIS — R809 Proteinuria, unspecified: Secondary | ICD-10-CM | POA: Diagnosis not present

## 2022-01-06 DIAGNOSIS — J439 Emphysema, unspecified: Secondary | ICD-10-CM | POA: Diagnosis not present

## 2022-01-06 DIAGNOSIS — Z1231 Encounter for screening mammogram for malignant neoplasm of breast: Secondary | ICD-10-CM | POA: Diagnosis not present

## 2022-01-06 DIAGNOSIS — E782 Mixed hyperlipidemia: Secondary | ICD-10-CM | POA: Diagnosis not present

## 2022-01-06 DIAGNOSIS — E1129 Type 2 diabetes mellitus with other diabetic kidney complication: Secondary | ICD-10-CM | POA: Diagnosis not present

## 2022-01-06 DIAGNOSIS — Z23 Encounter for immunization: Secondary | ICD-10-CM | POA: Diagnosis not present

## 2022-01-06 DIAGNOSIS — Z139 Encounter for screening, unspecified: Secondary | ICD-10-CM | POA: Diagnosis not present

## 2022-01-06 DIAGNOSIS — I700 Atherosclerosis of aorta: Secondary | ICD-10-CM | POA: Diagnosis not present

## 2022-01-06 DIAGNOSIS — I739 Peripheral vascular disease, unspecified: Secondary | ICD-10-CM | POA: Diagnosis not present

## 2022-01-06 DIAGNOSIS — I10 Essential (primary) hypertension: Secondary | ICD-10-CM | POA: Diagnosis not present

## 2022-01-06 DIAGNOSIS — L570 Actinic keratosis: Secondary | ICD-10-CM | POA: Diagnosis not present

## 2022-01-20 DIAGNOSIS — L570 Actinic keratosis: Secondary | ICD-10-CM | POA: Diagnosis not present

## 2022-02-06 DIAGNOSIS — H40003 Preglaucoma, unspecified, bilateral: Secondary | ICD-10-CM | POA: Diagnosis not present

## 2022-02-13 DIAGNOSIS — Z1231 Encounter for screening mammogram for malignant neoplasm of breast: Secondary | ICD-10-CM | POA: Diagnosis not present

## 2022-03-28 DIAGNOSIS — R6889 Other general symptoms and signs: Secondary | ICD-10-CM | POA: Diagnosis not present

## 2022-03-28 DIAGNOSIS — E1129 Type 2 diabetes mellitus with other diabetic kidney complication: Secondary | ICD-10-CM | POA: Diagnosis not present

## 2022-03-28 DIAGNOSIS — R809 Proteinuria, unspecified: Secondary | ICD-10-CM | POA: Diagnosis not present

## 2022-03-28 DIAGNOSIS — U071 COVID-19: Secondary | ICD-10-CM | POA: Diagnosis not present

## 2022-03-28 DIAGNOSIS — J439 Emphysema, unspecified: Secondary | ICD-10-CM | POA: Diagnosis not present

## 2022-04-17 DIAGNOSIS — R3915 Urgency of urination: Secondary | ICD-10-CM | POA: Diagnosis not present

## 2022-04-18 ENCOUNTER — Telehealth: Payer: Self-pay | Admitting: Cardiovascular Disease

## 2022-04-18 DIAGNOSIS — I739 Peripheral vascular disease, unspecified: Secondary | ICD-10-CM

## 2022-04-18 NOTE — Telephone Encounter (Signed)
Patient calling in to see if she is due for any procedure. She states she thinks she do to have a doppler done. Please advise

## 2022-04-18 NOTE — Telephone Encounter (Signed)
Patient asking if she needs dopplers before she comes to her appointment 07/18/22, because she usually has to have them before her appt.   I told her the only order I see is the order for a CT-Chest lung cancer screening placed by Dr Leonides Cave.  She said she was alright with not having to do the dopplers before appt, but wanted to make sure. I told her that I would forward question to provider.

## 2022-04-19 NOTE — Telephone Encounter (Signed)
Spoke with pt regarding her aorta/IVC/Iliac and ABI ultrasounds to be done prior to seeing Dr. Gwenlyn Found. Ultrasounds scheduled and pt verbalizes understanding. Reminders mailed to pt's home address.

## 2022-05-08 ENCOUNTER — Ambulatory Visit (HOSPITAL_COMMUNITY)
Admission: RE | Admit: 2022-05-08 | Discharge: 2022-05-08 | Disposition: A | Discharge status: Home / Self Care | Payer: Medicare Other | Source: Ambulatory Visit | Attending: Cardiovascular Disease | Admitting: Cardiovascular Disease

## 2022-05-08 DIAGNOSIS — I739 Peripheral vascular disease, unspecified: Secondary | ICD-10-CM | POA: Diagnosis not present

## 2022-05-08 LAB — VAS US ABI WITH/WO TBI
Left ABI: 1.16
Right ABI: 1.15

## 2022-05-29 ENCOUNTER — Encounter: Payer: Self-pay | Admitting: Cardiovascular Disease

## 2022-05-29 ENCOUNTER — Ambulatory Visit: Payer: Medicare Other | Attending: Cardiovascular Disease | Admitting: Cardiovascular Disease

## 2022-05-29 VITALS — BP 136/82 | HR 89 | Ht 64.5 in | Wt 141.6 lb

## 2022-05-29 DIAGNOSIS — E782 Mixed hyperlipidemia: Secondary | ICD-10-CM | POA: Diagnosis not present

## 2022-05-29 DIAGNOSIS — Z87891 Personal history of nicotine dependence: Secondary | ICD-10-CM | POA: Diagnosis not present

## 2022-05-29 DIAGNOSIS — I739 Peripheral vascular disease, unspecified: Secondary | ICD-10-CM | POA: Diagnosis not present

## 2022-05-29 DIAGNOSIS — I2510 Atherosclerotic heart disease of native coronary artery without angina pectoris: Secondary | ICD-10-CM

## 2022-05-29 DIAGNOSIS — I10 Essential (primary) hypertension: Secondary | ICD-10-CM

## 2022-05-29 NOTE — Assessment & Plan Note (Signed)
History of essential hypertension blood pressure measured today 170/78.  She is on losartan.  She checks her blood pressure at home which usually runs in the 130/70 range.

## 2022-05-29 NOTE — Assessment & Plan Note (Signed)
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  Her claudication resolved and her Dopplers have remained stable as recently as 05/08/2022.  This will be repeated on an annual basis.  She denies claudication.

## 2022-05-29 NOTE — Assessment & Plan Note (Signed)
History of hyperlipidemia on pravastatin and Zetia with lipid profile performed 01/06/2022 revealing total cholesterol 141, LDL 61 and HDL 49.

## 2022-05-29 NOTE — Progress Notes (Signed)
05/29/2022 [name] [name]   09/24/1945  [id number]  Primary Physician Hamrick, Lorin Mercy, MD Primary Cardiologist: Lorretta Harp MD Lupe Carney, [place]  HPI:  [name] [name] is a 77 y.o.    thin-appearing, widowed Caucasian female, mother of 1 child, grandmother to 2 grandchildren who I last saw in the office 05/27/2021.  She has a history of discontinued tobacco abuse, having stopped on September 10, 2009. She did have 45-pack-year history prior to that. She has hypertension and hyperlipidemia as well. Because of claudication, I angiogram'd her on November 15, 2006, revealing a distal abdominal aortic flap which I ultimately stented 1 month later with a 4-x-14 SMART Nitinol self-expanding stent. Followup Dopplers were normal and her claudication resolved. Dr. Lisbeth Ply follows her lipid profile. She does get short of breath probably related to COPD. She had a Myoview stress test performed in 2008 which was normal.    A chest CT done for cancer screening did mention coronary calcification and as a result she had a   Since I saw her a year ago she is remained stable.  She continues to complain of dyspnea.  She did have COVID 1 January which she is only now recovering from but does complain of chronic fatigue.   Current Meds  Medication Sig   albuterol (VENTOLIN HFA) 108 (90 Base) MCG/ACT inhaler Inhale into the lungs.   aspirin EC 81 MG tablet Take 1 tablet (81 mg total) by mouth daily.   Blood Glucose Calibration (ONETOUCH VERIO) High SOLN    clopidogrel (PLAVIX) 75 MG tablet Take 1 tablet by mouth once daily   losartan (COZAAR) 50 MG tablet Take 50 mg by mouth daily.   metFORMIN (GLUCOPHAGE-XR) 500 MG 24 hr tablet Take 500 mg by mouth daily.    Multiple Vitamin (MULTIVITAMIN) capsule Take 1 capsule by mouth daily.   ONETOUCH VERIO test strip 1 each daily.   pravastatin (PRAVACHOL) 80 MG tablet Take 80 mg by mouth daily.    SPIRIVA HANDIHALER 18 MCG inhalation capsule Place 1 puff into  inhaler and inhale daily as needed.     Allergies  Allergen Reactions   Codeine Photosensitivity   Crestor [Rosuvastatin] Other (See Comments)    Muscle pain   Lisinopril Cough    Social History   Socioeconomic History   Marital status: Married    Spouse name: Not on file   Number of children: 1   Years of education: Not on file   Highest education level: Not on file  Occupational History   Not on file  Tobacco Use   Smoking status: Former    Packs/day: 1.00    Years: 45.00    Additional pack years: 0.00    Total pack years: 45.00    Types: Cigarettes    Quit date: 03/13/2008    Years since quitting: 14.2   Smokeless tobacco: Former    Quit date: 07/16/2012  Vaping Use   Vaping Use: Never used  Substance and Sexual Activity   Alcohol use: No   Drug use: No   Sexual activity: Not on file  Other Topics Concern   Not on file  Social History Narrative   Not on file   Social Determinants of Health   Financial Resource Strain: Not on file  Food Insecurity: Not on file  Transportation Needs: Not on file  Physical Activity: Not on file  Stress: Not on file  Social Connections: Not on file  Intimate Partner Violence:  Not on file     Review of Systems: General: negative for chills, fever, night sweats or weight changes.  Cardiovascular: negative for chest pain, dyspnea on exertion, edema, orthopnea, palpitations, paroxysmal nocturnal dyspnea or shortness of breath Dermatological: negative for rash Respiratory: negative for cough or wheezing Urologic: negative for hematuria Abdominal: negative for nausea, vomiting, diarrhea, bright red blood per rectum, melena, or hematemesis Neurologic: negative for visual changes, syncope, or dizziness All other systems reviewed and are otherwise negative except as noted above.    Blood pressure (!) 170/78, pulse 89, height 5' 4.5" (1.638 m), weight 141 lb 9.6 oz (64.2 kg), SpO2 97 %.  General appearance: alert and no  distress Neck: no adenopathy, no JVD, supple, symmetrical, trachea midline, thyroid not enlarged, symmetric, no tenderness/mass/nodules, and left carotid Vali Capano Lungs: clear to auscultation bilaterally Heart: regular rate and rhythm, S1, S2 normal, no murmur, click, rub or gallop Extremities: extremities normal, atraumatic, no cyanosis or edema Pulses: 2+ and symmetric Skin: Skin color, texture, turgor normal. No rashes or lesions Neurologic: Grossly normal  EKG sinus rhythm at 89 with septal Q waves and nonspecific ST and T wave changes.  Personally reviewed this EKG.  ASSESSMENT AND PLAN:   Peripheral arterial disease ([facility]) History of pulm arterial disease status post distal abdominal aortic flap stenting by myself 11/15/2006 with a 4 cm x 14 mm Smart self-expanding stent.  Her claudication resolved and her Dopplers have remained stable as recently as 05/08/2022.  This will be repeated on an annual basis.  She denies claudication.  Essential hypertension History of essential hypertension blood pressure measured today 170/78.  She is on losartan.  She checks her blood pressure at home which usually runs in the 130/70 range.  Hyperlipidemia History of hyperlipidemia on pravastatin and Zetia with lipid profile performed 01/06/2022 revealing total cholesterol 141, LDL 61 and HDL 49.  Personal history of tobacco use, presenting hazards to health Long history tobacco abuse having stopped 09/10/2009 after smoking 45 pack years.  She does have COPD with chronic dyspnea.  CAD (coronary artery disease) History of coronary calcification seen on chest CT with subsequent negative Myoview stress test 05/05/2020.  She denies chest pain.     Lorretta Harp MD FACP,FACC,FAHA, [facility] 05/29/2022 1:45 PM

## 2022-05-29 NOTE — Assessment & Plan Note (Signed)
Long history tobacco abuse having stopped 09/10/2009 after smoking 45 pack years.  She does have COPD with chronic dyspnea.

## 2022-05-29 NOTE — Patient Instructions (Signed)
Medication Instructions:  Your physician recommends that you continue on your current medications as directed. Please refer to the Current Medication list given to you today.  *If you need a refill on your cardiac medications before your next appointment, please call your pharmacy*   Testing/Procedures: Your physician has requested that you have an Aorta/Iliac Duplex. This will be take place at [address], [address].  No food after 11PM the night before.  Water is OK. (Don't drink liquids if you have been instructed not to for ANOTHER test) Avoid foods that produce bowel gas, for 24 hours prior to exam (see below). No breakfast, no chewing gum, no smoking or carbonated beverages. Patient may take morning medications with water. Come in for test at least 15 minutes early to register. To be done in February 2025.  Your physician has requested that you have an ankle brachial index (ABI). During this test an ultrasound and blood pressure cuff are used to evaluate the arteries that supply the arms and legs with blood. Allow thirty minutes for this exam. There are no restrictions or special instructions. This will take place at [address], [address].   To be done in February 2025.    Follow-Up: At [facility], you and your health needs are our priority.  As part of our continuing mission to provide you with exceptional heart care, we have created designated Provider Care Teams.  These Care Teams include your primary Cardiologist (physician) and Advanced Practice Providers (APPs -  Physician Assistants and Nurse Practitioners) who all work together to provide you with the care you need, when you need it.  We recommend signing up for the patient portal called "MyChart".  Sign up information is provided on this After Visit Summary.  MyChart is used to connect with patients for Virtual Visits (Telemedicine).  Patients are able to view lab/test results, encounter notes,  upcoming appointments, etc.  Non-urgent messages can be sent to your provider as well.   To learn more about what you can do with MyChart, go to [email or web address].    Your next appointment:   12 month(s)  Provider:   Quay Burow, MD

## 2022-05-29 NOTE — Assessment & Plan Note (Signed)
History of coronary calcification seen on chest CT with subsequent negative Myoview stress test 05/05/2020.  She denies chest pain.

## 2022-07-07 DIAGNOSIS — J439 Emphysema, unspecified: Secondary | ICD-10-CM | POA: Diagnosis not present

## 2022-07-07 DIAGNOSIS — E782 Mixed hyperlipidemia: Secondary | ICD-10-CM | POA: Diagnosis not present

## 2022-07-07 DIAGNOSIS — E1129 Type 2 diabetes mellitus with other diabetic kidney complication: Secondary | ICD-10-CM | POA: Diagnosis not present

## 2022-07-07 DIAGNOSIS — I700 Atherosclerosis of aorta: Secondary | ICD-10-CM | POA: Diagnosis not present

## 2022-07-07 DIAGNOSIS — L718 Other rosacea: Secondary | ICD-10-CM | POA: Diagnosis not present

## 2022-07-07 DIAGNOSIS — I10 Essential (primary) hypertension: Secondary | ICD-10-CM | POA: Diagnosis not present

## 2022-07-07 DIAGNOSIS — I739 Peripheral vascular disease, unspecified: Secondary | ICD-10-CM | POA: Diagnosis not present

## 2022-07-07 DIAGNOSIS — R809 Proteinuria, unspecified: Secondary | ICD-10-CM | POA: Diagnosis not present

## 2022-07-14 ENCOUNTER — Other Ambulatory Visit: Payer: Self-pay | Admitting: Family Medicine

## 2022-07-14 DIAGNOSIS — Z87891 Personal history of nicotine dependence: Secondary | ICD-10-CM

## 2022-07-18 ENCOUNTER — Ambulatory Visit: Payer: Medicare Other | Admitting: Cardiovascular Disease

## 2022-07-28 ENCOUNTER — Ambulatory Visit
Admission: RE | Admit: 2022-07-28 | Discharge: 2022-07-28 | Disposition: A | Discharge status: Home / Self Care | Payer: Medicare Other | Source: Ambulatory Visit | Attending: Family Medicine | Admitting: Family Medicine

## 2022-07-28 DIAGNOSIS — Z87891 Personal history of nicotine dependence: Secondary | ICD-10-CM

## 2022-10-03 DIAGNOSIS — J441 Chronic obstructive pulmonary disease with (acute) exacerbation: Secondary | ICD-10-CM | POA: Diagnosis not present

## 2022-10-18 DIAGNOSIS — E1129 Type 2 diabetes mellitus with other diabetic kidney complication: Secondary | ICD-10-CM | POA: Diagnosis not present

## 2022-10-18 DIAGNOSIS — R809 Proteinuria, unspecified: Secondary | ICD-10-CM | POA: Diagnosis not present

## 2022-11-27 ENCOUNTER — Ambulatory Visit: Payer: Medicare Other | Admitting: Dermatology

## 2022-11-28 ENCOUNTER — Emergency Department
Admission: EM | Admit: 2022-11-28 | Discharge: 2022-11-28 | Disposition: A | Discharge status: Home / Self Care | Payer: Medicare Other | Attending: Emergency Medicine | Admitting: Emergency Medicine

## 2022-11-28 ENCOUNTER — Other Ambulatory Visit: Payer: Self-pay

## 2022-11-28 ENCOUNTER — Emergency Department: Payer: Medicare Other

## 2022-11-28 DIAGNOSIS — R0602 Shortness of breath: Secondary | ICD-10-CM | POA: Diagnosis not present

## 2022-11-28 DIAGNOSIS — I700 Atherosclerosis of aorta: Secondary | ICD-10-CM | POA: Diagnosis not present

## 2022-11-28 DIAGNOSIS — J441 Chronic obstructive pulmonary disease with (acute) exacerbation: Secondary | ICD-10-CM | POA: Insufficient documentation

## 2022-11-28 DIAGNOSIS — I10 Essential (primary) hypertension: Secondary | ICD-10-CM | POA: Insufficient documentation

## 2022-11-28 DIAGNOSIS — Z743 Need for continuous supervision: Secondary | ICD-10-CM | POA: Diagnosis not present

## 2022-11-28 DIAGNOSIS — R918 Other nonspecific abnormal finding of lung field: Secondary | ICD-10-CM | POA: Diagnosis not present

## 2022-11-28 DIAGNOSIS — E119 Type 2 diabetes mellitus without complications: Secondary | ICD-10-CM | POA: Insufficient documentation

## 2022-11-28 DIAGNOSIS — R6889 Other general symptoms and signs: Secondary | ICD-10-CM | POA: Diagnosis not present

## 2022-11-28 LAB — CBC
HCT: 35.6 % — ABNORMAL LOW (ref 36.0–46.0)
Hemoglobin: 11.8 g/dL — ABNORMAL LOW (ref 12.0–15.0)
MCH: 28 pg (ref 26.0–34.0)
MCHC: 33.1 g/dL (ref 30.0–36.0)
MCV: 84.6 fL (ref 80.0–100.0)
Platelets: 379 10*3/uL (ref 150–400)
RBC: 4.21 MIL/uL (ref 3.87–5.11)
RDW: 13.1 % (ref 11.5–15.5)
WBC: 10.1 10*3/uL (ref 4.0–10.5)
nRBC: 0 % (ref 0.0–0.2)

## 2022-11-28 LAB — BASIC METABOLIC PANEL
Anion gap: 10 (ref 5–15)
BUN: 10 mg/dL (ref 8–23)
CO2: 22 mmol/L (ref 22–32)
Calcium: 9.2 mg/dL (ref 8.9–10.3)
Chloride: 98 mmol/L (ref 98–111)
Creatinine, Ser: 0.83 mg/dL (ref 0.44–1.00)
GFR, Estimated: >60 mL/min (ref >60)
Glucose, Bld: 146 mg/dL — ABNORMAL HIGH (ref 70–99)
Potassium: 4.3 mmol/L (ref 3.5–5.1)
Sodium: 130 mmol/L — ABNORMAL LOW (ref 135–145)

## 2022-11-28 LAB — BRAIN NATRIURETIC PEPTIDE: B Natriuretic Peptide: 15.2 pg/mL (ref 0.0–100.0)

## 2022-11-28 LAB — TROPONIN I (HIGH SENSITIVITY): Troponin I (High Sensitivity): 4 ng/L (ref <18)

## 2022-11-28 MED ORDER — IPRATROPIUM-ALBUTEROL 0.5-2.5 (3) MG/3ML IN SOLN
3.0000 mL | Freq: Once | RESPIRATORY_TRACT | Status: AC
  Administered 2022-11-28: 3 mL via RESPIRATORY_TRACT
  Filled 2022-11-28: qty 3

## 2022-11-28 MED ORDER — ALBUTEROL SULFATE HFA 108 (90 BASE) MCG/ACT IN AERS: 2.0000 | INHALATION_SPRAY | RESPIRATORY_TRACT | 1 refills | Status: DC | PRN

## 2022-11-28 MED ORDER — AZITHROMYCIN 250 MG PO TABS
ORAL_TABLET | ORAL | 0 refills | Status: DC
Start: 2022-11-28 — End: 2023-01-09

## 2022-11-28 MED ORDER — PREDNISONE 20 MG PO TABS: 40.0000 mg | ORAL_TABLET | Freq: Every day | ORAL | 0 refills | Status: DC

## 2022-11-28 MED ORDER — PREDNISONE 20 MG PO TABS
60.0000 mg | ORAL_TABLET | Freq: Once | ORAL | Status: AC
  Administered 2022-11-28: 60 mg via ORAL
  Filled 2022-11-28: qty 3

## 2022-11-28 MED ORDER — ALBUTEROL SULFATE HFA 108 (90 BASE) MCG/ACT IN AERS: 2.0000 | INHALATION_SPRAY | RESPIRATORY_TRACT | 1 refills | Status: AC | PRN

## 2022-11-28 MED ORDER — PREDNISONE 20 MG PO TABS: 40.0000 mg | ORAL_TABLET | Freq: Every day | ORAL | 0 refills | Status: AC

## 2022-11-28 MED ORDER — AZITHROMYCIN 250 MG PO TABS
ORAL_TABLET | ORAL | 0 refills | Status: DC
Start: 2022-11-28 — End: 2022-11-28

## 2022-11-28 NOTE — ED Triage Notes (Signed)
Pt comes via [facility] from home with c/o sob with exertion worse for two weeks. Pt has hx of copd. Lungs clear.  VSS

## 2022-11-28 NOTE — Discharge Instructions (Signed)
START the prednisone and azithromycin   Use your albuterol inhaler every 4 hours when awake for 24 hours then as needed for wheezing  Continue your other medications

## 2022-11-28 NOTE — ED Notes (Signed)
Pt son called for update on patient. Got consent from patient to give son an update on her status at this time.

## 2022-11-28 NOTE — ED Provider Notes (Signed)
[facility] Provider Note    Event Date/Time   First MD Initiated Contact with Patient 11/28/22 1325     (approximate)   History   Shortness of Breath   HPI  [name] [name] is a 77 y.o. female with history of COPD, diabetes, hypertension, hyperlipidemia, here shortness of breath.  The patient states that she did yard work 2 weeks ago and feels like she was out in the heat too long.  Since then, she has had a cough, wheezing, and shortness of breath with exertion.  She states she feels overall well at rest.  Symptoms seem to have persisted longer than usual.  She has been using her Hailer without relief.  No fevers or chills.  No leg swelling.     Physical Exam   Triage Vital Signs: ED Triage Vitals  Encounter Vitals Group     BP 11/28/22 1118 121/74     Systolic BP Percentile --      Diastolic BP Percentile --      Pulse Rate 11/28/22 1118 80     Resp 11/28/22 1118 17     Temp 11/28/22 1118 97.9 F (36.6 C)     Temp Source 11/28/22 1118 Oral     SpO2 11/28/22 1118 96 %     Weight 11/28/22 1137 140 lb (63.5 kg)     Height 11/28/22 1137 5\' 4"  (1.626 m)     Head Circumference --      Peak Flow --      Pain Score 11/28/22 1116 0     Pain Loc --      Pain Education --      Exclude from Growth Chart --     Most recent vital signs: Vitals:   11/28/22 1400 11/28/22 1500  BP: (!) 153/68 (!) 152/65  Pulse: 76 78  Resp: 17 13  Temp:  98 F (36.7 C)  SpO2: 99% 96%     General: Awake, no distress.  CV:  Good peripheral perfusion.  Regular rate and rhythm. Resp:  Normal work of breathing.  Scant expiratory wheezes, no rales or rhonchi. Abd:  No distention.  No tenderness. Other:  No lower extremity edema.   ED Results / Procedures / Treatments   Labs (all labs ordered are listed, but only abnormal results are displayed) Labs Reviewed  BASIC METABOLIC PANEL - Abnormal; Notable for the following components:      Result Value   Sodium 130  (*)    Glucose, Bld 146 (*)    All other components within normal limits  CBC - Abnormal; Notable for the following components:   Hemoglobin 11.8 (*)    HCT 35.6 (*)    All other components within normal limits  BRAIN NATRIURETIC PEPTIDE  TROPONIN I (HIGH SENSITIVITY)     EKG Normal sinus rhythm, ventricular at 82.  PR 186, QRS 88, QTc 427.  No acute ST elevations or depressions.  No acute evidence of acute ischemia or infarct   RADIOLOGY Chest x-ray: Findings suggest COPD, otherwise no acute abnormality   I also independently reviewed and agree with radiologist interpretations.   PROCEDURES:  Critical Care performed: No   MEDICATIONS ORDERED IN ED: Medications  predniSONE (DELTASONE) tablet 60 mg (60 mg Oral Given 11/28/22 1400)  ipratropium-albuterol (DUONEB) 0.5-2.5 (3) MG/3ML nebulizer solution 3 mL (3 mLs Nebulization Given 11/28/22 1401)  ipratropium-albuterol (DUONEB) 0.5-2.5 (3) MG/3ML nebulizer solution 3 mL (3 mLs Nebulization Given 11/28/22 1401)  IMPRESSION / MDM / ASSESSMENT AND PLAN / ED COURSE  I reviewed the triage vital signs and the nursing notes.                              Differential diagnosis includes, but is not limited to, COPD exacerbation, CHF, ACS, anemia, deconditioning  Patient's presentation is most consistent with acute presentation with potential threat to life or bodily function.  The patient is on the cardiac monitor to evaluate for evidence of arrhythmia and/or significant heart rate changes   77 year old female here with wheezing and shortness of breath.  Clinically, suspect mild COPD exacerbation.  The patient is not hypoxic or tachypneic.  Her lungs are overall clear.  She is satting well on room air.  Chest x-ray reviewed and is clear.  CBC and BMP are largely unremarkable.  She does appear mildly dehydrated for which I encouraged her to drink fluids.  Troponin and BNP are negative.  She has no chest pain.  EKG is nonischemic.   Will treat with steroids, increased albuterol at home, as well as azithromycin given her history of COPD.   FINAL CLINICAL IMPRESSION(S) / ED DIAGNOSES   Final diagnoses:  COPD exacerbation (HCC)     Rx / DC Orders   ED Discharge Orders          Ordered    predniSONE (DELTASONE) 20 MG tablet  Daily,   Status:  Discontinued        11/28/22 1515    albuterol (VENTOLIN HFA) 108 (90 Base) MCG/ACT inhaler  Every 4 hours PRN,   Status:  Discontinued        11/28/22 1515    azithromycin (ZITHROMAX Z-PAK) 250 MG tablet  Status:  Discontinued        11/28/22 1515    albuterol (VENTOLIN HFA) 108 (90 Base) MCG/ACT inhaler  Every 4 hours PRN        11/28/22 1528    azithromycin (ZITHROMAX Z-PAK) 250 MG tablet        11/28/22 1528    predniSONE (DELTASONE) 20 MG tablet  Daily        11/28/22 1528             Note:  This document was prepared using Dragon voice recognition software and may include unintentional dictation errors.   Shaune Pollack, MD 11/28/22 [phone]

## 2022-12-22 DIAGNOSIS — E119 Type 2 diabetes mellitus without complications: Secondary | ICD-10-CM | POA: Diagnosis not present

## 2022-12-22 DIAGNOSIS — H40003 Preglaucoma, unspecified, bilateral: Secondary | ICD-10-CM | POA: Diagnosis not present

## 2022-12-22 DIAGNOSIS — H2513 Age-related nuclear cataract, bilateral: Secondary | ICD-10-CM | POA: Diagnosis not present

## 2022-12-29 ENCOUNTER — Telehealth: Payer: Self-pay

## 2022-12-29 DIAGNOSIS — E1129 Type 2 diabetes mellitus with other diabetic kidney complication: Secondary | ICD-10-CM | POA: Diagnosis not present

## 2022-12-29 DIAGNOSIS — I739 Peripheral vascular disease, unspecified: Secondary | ICD-10-CM | POA: Diagnosis not present

## 2022-12-29 DIAGNOSIS — E782 Mixed hyperlipidemia: Secondary | ICD-10-CM | POA: Diagnosis not present

## 2022-12-29 DIAGNOSIS — Z79899 Other long term (current) drug therapy: Secondary | ICD-10-CM | POA: Diagnosis not present

## 2022-12-29 DIAGNOSIS — R809 Proteinuria, unspecified: Secondary | ICD-10-CM | POA: Diagnosis not present

## 2022-12-29 DIAGNOSIS — I700 Atherosclerosis of aorta: Secondary | ICD-10-CM | POA: Diagnosis not present

## 2022-12-29 DIAGNOSIS — Z23 Encounter for immunization: Secondary | ICD-10-CM | POA: Diagnosis not present

## 2022-12-29 DIAGNOSIS — I10 Essential (primary) hypertension: Secondary | ICD-10-CM | POA: Diagnosis not present

## 2022-12-29 DIAGNOSIS — J439 Emphysema, unspecified: Secondary | ICD-10-CM | POA: Diagnosis not present

## 2022-12-29 DIAGNOSIS — Z9181 History of falling: Secondary | ICD-10-CM | POA: Diagnosis not present

## 2022-12-29 NOTE — Telephone Encounter (Signed)
Transition Care Management Follow-up Telephone Call Date of discharge and from where: [place] 9/17 How have you been since you were released from the hospital? Doing great and has followed up with the PCP Any questions or concerns? No  Items Reviewed: Did the pt receive and understand the discharge instructions provided? Yes  Medications obtained and verified? No  Other? No  Any new allergies since your discharge? No  Dietary orders reviewed? No Do you have support at home? Yes     Follow up appointments reviewed:  PCP Hospital f/u appt confirmed? Yes  Scheduled to see  on  @ . Specialist Hospital f/u appt confirmed? No  Scheduled to see  on  @ . Are transportation arrangements needed? No  If their condition worsens, is the pt aware to call PCP or go to the Emergency Dept.? Yes Was the patient provided with contact information for the PCP's office or ED? Yes Was to pt encouraged to call back with questions or concerns? Yes

## 2023-01-01 ENCOUNTER — Telehealth: Payer: Self-pay

## 2023-01-01 NOTE — Telephone Encounter (Signed)
Transition Care Management Follow-up Telephone Call Date of discharge and from where: [facility]  9/17 How have you been since you were released from the hospital? Doing well and has followed up with provider Any questions or concerns? No  Items Reviewed: Did the pt receive and understand the discharge instructions provided? Yes  Medications obtained and verified? Yes  Other? No  Any new allergies since your discharge? No  Dietary orders reviewed? No Do you have support at home? Yes     Follow up appointments reviewed:  PCP Hospital f/u appt confirmed? Yes  Scheduled to see  on  @ . Specialist Hospital f/u appt confirmed? No  Scheduled to see  on  @  Are transportation arrangements needed?  If their condition worsens, is the pt aware to call PCP or go to the Emergency Dept.? Yes Was the patient provided with contact information for the PCP's office or ED? Yes Was to pt encouraged to call back with questions or concerns? Yes

## 2023-01-08 DIAGNOSIS — H2511 Age-related nuclear cataract, right eye: Secondary | ICD-10-CM | POA: Diagnosis not present

## 2023-01-09 ENCOUNTER — Encounter: Payer: Self-pay | Admitting: Ophthalmology

## 2023-01-10 NOTE — Anesthesia Preprocedure Evaluation (Addendum)
Anesthesia Evaluation  Patient identified by MRN, date of birth, ID band Patient awake    Reviewed: Allergy & Precautions, H&P , NPO status , Patient's Chart, lab work & pertinent test results  Airway Mallampati: III  TM Distance: <3 FB Neck ROM: Full    Dental no notable dental hx. (+) Lower Dentures, Upper Dentures   Pulmonary COPD, Patient abstained from smoking., former smoker   Pulmonary exam normal breath sounds clear to auscultation       Cardiovascular hypertension, + CAD, + Peripheral Vascular Disease and + DOE  Normal cardiovascular exam Rhythm:Regular Rate:Normal     Neuro/Psych negative neurological ROS  negative psych ROS   GI/Hepatic negative GI ROS, Neg liver ROS,,,  Endo/Other  diabetes    Renal/GU negative Renal ROS  negative genitourinary   Musculoskeletal negative musculoskeletal ROS (+)    Abdominal   Peds negative pediatric ROS (+)  Hematology negative hematology ROS (+)   Anesthesia Other Findings Medical History  Hypertension  Hyperlipidemia COPD (chronic obstructive pulmonary disease)  PVD (peripheral vascular disease)  Smoking Emphysema of lung (HCC) Diabetes mellitus without complication wears dentures Vertigo  Motion sickness    Reproductive/Obstetrics negative OB ROS                             Anesthesia Physical Anesthesia Plan  ASA: 3  Anesthesia Plan: MAC   Post-op Pain Management:    Induction: Intravenous  PONV Risk Score and Plan:   Airway Management Planned: Natural Airway and Nasal Cannula  Additional Equipment:   Intra-op Plan:   Post-operative Plan:   Informed Consent: I have reviewed the patients History and Physical, chart, labs and discussed the procedure including the risks, benefits and alternatives for the proposed anesthesia with the patient or authorized representative who has indicated his/her understanding and  acceptance.     Dental Advisory Given  Plan Discussed with: Anesthesiologist, CRNA and Surgeon  Anesthesia Plan Comments: (Patient consented for risks of anesthesia including but not limited to:  - adverse reactions to medications - damage to eyes, teeth, lips or other oral mucosa - nerve damage due to positioning  - sore throat or hoarseness - Damage to heart, brain, nerves, lungs, other parts of body or loss of life  Patient voiced understanding and assent.)       Anesthesia Quick Evaluation

## 2023-01-16 NOTE — Discharge Instructions (Signed)

## 2023-01-17 ENCOUNTER — Ambulatory Visit: Payer: Medicare Other | Admitting: Anesthesiology

## 2023-01-17 ENCOUNTER — Encounter
Admission: RE | Disposition: A | Discharge status: Home / Self Care | Payer: Self-pay | Source: Home / Self Care | Attending: Ophthalmology

## 2023-01-17 ENCOUNTER — Other Ambulatory Visit: Payer: Self-pay

## 2023-01-17 ENCOUNTER — Encounter: Payer: Self-pay | Admitting: Ophthalmology

## 2023-01-17 ENCOUNTER — Ambulatory Visit
Admission: RE | Admit: 2023-01-17 | Discharge: 2023-01-17 | Disposition: A | Discharge status: Home / Self Care | Payer: Medicare Other | Attending: Ophthalmology | Admitting: Ophthalmology

## 2023-01-17 DIAGNOSIS — H2512 Age-related nuclear cataract, left eye: Secondary | ICD-10-CM | POA: Diagnosis not present

## 2023-01-17 DIAGNOSIS — Z955 Presence of coronary angioplasty implant and graft: Secondary | ICD-10-CM | POA: Insufficient documentation

## 2023-01-17 DIAGNOSIS — E1151 Type 2 diabetes mellitus with diabetic peripheral angiopathy without gangrene: Secondary | ICD-10-CM | POA: Diagnosis not present

## 2023-01-17 DIAGNOSIS — Z7984 Long term (current) use of oral hypoglycemic drugs: Secondary | ICD-10-CM | POA: Insufficient documentation

## 2023-01-17 DIAGNOSIS — E1136 Type 2 diabetes mellitus with diabetic cataract: Secondary | ICD-10-CM | POA: Diagnosis not present

## 2023-01-17 DIAGNOSIS — J439 Emphysema, unspecified: Secondary | ICD-10-CM | POA: Diagnosis not present

## 2023-01-17 DIAGNOSIS — Z7902 Long term (current) use of antithrombotics/antiplatelets: Secondary | ICD-10-CM | POA: Insufficient documentation

## 2023-01-17 DIAGNOSIS — H2511 Age-related nuclear cataract, right eye: Secondary | ICD-10-CM | POA: Diagnosis not present

## 2023-01-17 DIAGNOSIS — Z87891 Personal history of nicotine dependence: Secondary | ICD-10-CM | POA: Diagnosis not present

## 2023-01-17 DIAGNOSIS — I10 Essential (primary) hypertension: Secondary | ICD-10-CM | POA: Diagnosis not present

## 2023-01-17 DIAGNOSIS — I2510 Atherosclerotic heart disease of native coronary artery without angina pectoris: Secondary | ICD-10-CM | POA: Insufficient documentation

## 2023-01-17 HISTORY — DX: Motion sickness, initial encounter: T75.3XXA

## 2023-01-17 HISTORY — DX: Presence of dental prosthetic device (complete) (partial): Z97.2

## 2023-01-17 HISTORY — DX: Dizziness and giddiness: R42

## 2023-01-17 HISTORY — PX: CATARACT EXTRACTION W/PHACO: SHX586

## 2023-01-17 LAB — GLUCOSE, CAPILLARY: Glucose-Capillary: 180 mg/dL — ABNORMAL HIGH (ref 70–99)

## 2023-01-17 SURGERY — PHACOEMULSIFICATION, CATARACT, WITH IOL INSERTION
Anesthesia: Monitor Anesthesia Care | Laterality: Right

## 2023-01-17 MED ORDER — SIGHTPATH DOSE#1 BSS IO SOLN
INTRAOCULAR | Status: DC | PRN
  Administered 2023-01-17: 15 mL via INTRAOCULAR

## 2023-01-17 MED ORDER — ARMC OPHTHALMIC DILATING DROPS
OPHTHALMIC | Status: AC
  Filled 2023-01-17: qty 0.5

## 2023-01-17 MED ORDER — TETRACAINE HCL 0.5 % OP SOLN
OPHTHALMIC | Status: AC
  Filled 2023-01-17: qty 4

## 2023-01-17 MED ORDER — SIGHTPATH DOSE#1 BSS IO SOLN
INTRAOCULAR | Status: DC | PRN
  Administered 2023-01-17: 65 mL via OPHTHALMIC

## 2023-01-17 MED ORDER — TETRACAINE HCL 0.5 % OP SOLN
1.0000 [drp] | OPHTHALMIC | Status: DC | PRN
  Administered 2023-01-17 (×3): 1 [drp] via OPHTHALMIC

## 2023-01-17 MED ORDER — BRIMONIDINE TARTRATE-TIMOLOL 0.2-0.5 % OP SOLN
OPHTHALMIC | Status: DC | PRN
  Administered 2023-01-17: 1 [drp] via OPHTHALMIC

## 2023-01-17 MED ORDER — ARMC OPHTHALMIC DILATING DROPS
1.0000 | OPHTHALMIC | Status: DC | PRN
  Administered 2023-01-17 (×3): 1 via OPHTHALMIC

## 2023-01-17 MED ORDER — CEFUROXIME OPHTHALMIC INJECTION 1 MG/0.1 ML
INJECTION | OPHTHALMIC | Status: DC | PRN
  Administered 2023-01-17: 1 mg via INTRACAMERAL

## 2023-01-17 MED ORDER — FENTANYL CITRATE (PF) 100 MCG/2ML IJ SOLN
INTRAMUSCULAR | Status: AC
  Filled 2023-01-17: qty 2

## 2023-01-17 MED ORDER — MIDAZOLAM HCL 2 MG/2ML IJ SOLN
INTRAMUSCULAR | Status: DC | PRN
  Administered 2023-01-17 (×2): 1 mg via INTRAVENOUS

## 2023-01-17 MED ORDER — FENTANYL CITRATE (PF) 100 MCG/2ML IJ SOLN
INTRAMUSCULAR | Status: DC | PRN
  Administered 2023-01-17: 50 ug via INTRAVENOUS

## 2023-01-17 MED ORDER — SIGHTPATH DOSE#1 NA HYALUR & NA CHOND-NA HYALUR IO KIT
PACK | INTRAOCULAR | Status: DC | PRN
  Administered 2023-01-17: 1 via OPHTHALMIC

## 2023-01-17 MED ORDER — SIGHTPATH DOSE#1 BSS IO SOLN
INTRAOCULAR | Status: DC | PRN
  Administered 2023-01-17: 2 mL

## 2023-01-17 MED ORDER — MIDAZOLAM HCL 2 MG/2ML IJ SOLN
INTRAMUSCULAR | Status: AC
  Filled 2023-01-17: qty 2

## 2023-01-17 SURGICAL SUPPLY — 18 items
CANNULA ANT/CHMB 27G (MISCELLANEOUS) IMPLANT
CANNULA ANT/CHMB 27GA (MISCELLANEOUS)
CATARACT SUITE SIGHTPATH (MISCELLANEOUS) ×1
FEE CATARACT SUITE SIGHTPATH (MISCELLANEOUS) ×2 IMPLANT
GLOVE SRG 8 PF TXTR STRL LF DI (GLOVE) ×2 IMPLANT
GLOVE SURG ENC TEXT LTX SZ7.5 (GLOVE) ×2 IMPLANT
GLOVE SURG GAMMEX PI TX LF 7.5 (GLOVE) IMPLANT
GLOVE SURG UNDER POLY LF SZ8 (GLOVE) ×1
LENS IOL TECNIS EYHANCE 20.5 (Intraocular Lens) IMPLANT
NDL FILTER BLUNT 18X1 1/2 (NEEDLE) ×2 IMPLANT
NDL RETROBULBAR .5 NSTRL (NEEDLE) IMPLANT
NEEDLE FILTER BLUNT 18X1 1/2 (NEEDLE) ×1
PACK VIT ANT 23G (MISCELLANEOUS) IMPLANT
RING MALYGIN 7.0 (MISCELLANEOUS) IMPLANT
SUT ETHILON 10-0 CS-B-6CS-B-6 (SUTURE)
SUT VICRYL 9 0 (SUTURE) IMPLANT
SUTURE EHLN 10-0 CS-B-6CS-B-6 (SUTURE) IMPLANT
SYR 3ML LL SCALE MARK (SYRINGE) ×2 IMPLANT

## 2023-01-17 NOTE — Op Note (Signed)
LOCATION:  [facility]   PREOPERATIVE DIAGNOSIS:    Nuclear sclerotic cataract right eye. H25.11   POSTOPERATIVE DIAGNOSIS:  Nuclear sclerotic cataract right eye.     PROCEDURE:  Phacoemusification with posterior chamber intraocular lens placement of the right eye   ULTRASOUND TIME: Procedure(s): CATARACT EXTRACTION PHACO AND INTRAOCULAR LENS PLACEMENT (IOC) RIGHT DIABETIC 4.95 00:27.3 (Right)  LENS:   Implant Name Type Inv. Item Serial No. Manufacturer Lot No. LRB No. Used Action  LENS IOL TECNIS EYHANCE 20.5 - [id number] Intraocular Lens LENS IOL TECNIS EYHANCE 20.5 [id number] [employer]  Right 1 Implanted         SURGEON:  Deirdre Evener, MD   ANESTHESIA:  Topical with tetracaine drops and 2% Xylocaine jelly, augmented with 1% preservative-free intracameral lidocaine.    COMPLICATIONS:  None.   DESCRIPTION OF PROCEDURE:  The patient was identified in the holding room and transported to the operating room and placed in the supine position under the operating microscope.  The right eye was identified as the operative eye and it was prepped and draped in the usual sterile ophthalmic fashion.   A 1 millimeter clear-corneal paracentesis was made at the 12:00 position.  0.5 ml of preservative-free 1% lidocaine was injected into the anterior chamber. The anterior chamber was filled with Viscoat viscoelastic.  A 2.4 millimeter keratome was used to make a near-clear corneal incision at the 9:00 position.  A curvilinear capsulorrhexis was made with a cystotome and capsulorrhexis forceps.  Balanced salt solution was used to hydrodissect and hydrodelineate the nucleus.   Phacoemulsification was then used in stop and chop fashion to remove the lens nucleus and epinucleus.  The remaining cortex was then removed using the irrigation and aspiration handpiece. Provisc was then placed into the capsular bag to distend it for lens placement.  A lens was then injected into the capsular  bag.  The remaining viscoelastic was aspirated.   Wounds were hydrated with balanced salt solution.  The anterior chamber was inflated to a physiologic pressure with balanced salt solution.  No wound leaks were noted. Cefuroxime 0.1 ml of a 10mg /ml solution was injected into the anterior chamber for a dose of 1 mg of intracameral antibiotic at the completion of the case.   Timolol and Brimonidine drops were applied to the eye.  The patient was taken to the recovery room in stable condition without complications of anesthesia or surgery.   Madoc Holquin 01/17/2023, 9:43 AM

## 2023-01-17 NOTE — H&P (Signed)
[facility]   Primary Care Physician:  Ailene Ravel, MD Ophthalmologist: Dr. Lockie Mola  Pre-Procedure History & Physical: HPI:  [name] [name] is a 77 y.o. female here for ophthalmic surgery.   Past Medical History:  Diagnosis Date   COPD (chronic obstructive pulmonary disease) (HCC)    Diabetes mellitus without complication (HCC)    Emphysema of lung (HCC)    Hyperlipidemia    Hypertension    Motion sickness    planes   Personal history of tobacco use, presenting hazards to health 04/01/2015   PVD (peripheral vascular disease) (HCC)    abdominal aorta stent   Vertigo    none for over 1 year   Wears dentures    full upper and lower    Past Surgical History:  Procedure Laterality Date   ABDOMINAL AORTA STENT  12/19/2006   PTA & stent with 4x14 nitinol self-expanding stent with IBIS guidance - diagnostic cath 11/15/2006 (Dr. Erlene Quan)   ABDOMINAL HYSTERECTOMY  1980   Total   APPENDECTOMY  1967   NM MYOCAR PERF WALL MOTION  10/2006   persantine - EF 77%, breast attenuation, low risk, normal perfusion    Prior to Admission medications   Medication Sig Start Date End Date Taking? Authorizing Provider  acetaminophen (TYLENOL) 325 MG tablet Take 325 mg by mouth every 6 (six) hours as needed.   Yes [provider]  albuterol (VENTOLIN HFA) 108 (90 Base) MCG/ACT inhaler Inhale 2 puffs into the lungs every 4 (four) hours as needed for wheezing or shortness of breath. 11/28/22  Yes Shaune Pollack, MD  aspirin EC 81 MG tablet Take 1 tablet (81 mg total) by mouth daily. 02/05/18  Yes Runell Gess, MD  clopidogrel (PLAVIX) 75 MG tablet Take 1 tablet by mouth once daily 10/07/19  Yes Runell Gess, MD  ezetimibe (ZETIA) 10 MG tablet Take 1 tablet (10 mg total) by mouth daily. 02/03/19 01/09/23 Yes Runell Gess, MD  losartan (COZAAR) 50 MG tablet Take 100 mg by mouth daily. 04/02/20  Yes [provider]  metFORMIN (GLUCOPHAGE-XR) 500 MG  24 hr tablet Take 750 mg by mouth daily. 06/24/13  Yes [provider]  Multiple Vitamin (MULTIVITAMIN) capsule Take 1 capsule by mouth daily.   Yes [provider]  Omega-3 Fatty Acids (FISH OIL PO) Take by mouth daily.   Yes [provider]  pravastatin (PRAVACHOL) 80 MG tablet Take 40 mg by mouth daily.   Yes [provider]  SPIRIVA HANDIHALER 18 MCG inhalation capsule Place 1 puff into inhaler and inhale daily as needed. 10/25/15  Yes [provider]  Blood Glucose Calibration (ONETOUCH VERIO) High SOLN  03/28/21   [provider]  [facility] VERIO test strip 1 each daily. 03/28/21   [provider]    Allergies as of 12/27/2022 - Review Complete 11/28/2022  Allergen Reaction Noted   Codeine Photosensitivity 07/15/2013   Crestor [rosuvastatin] Other (See Comments) 07/15/2013   Lisinopril Cough 07/15/2013    Family History  Problem Relation Age of Onset   Stroke Mother    Lung disease Father    Heart attack Brother        in 29s   Heart disease Brother    Cancer Maternal Aunt        Breat Cancer   Cancer Maternal Grandmother        Breast Cancer    Social History   Socioeconomic History   Marital status:  Widowed    Spouse name: Not on file   Number of children: 1   Years of education: Not on file   Highest education level: Not on file  Occupational History   Not on file  Tobacco Use   Smoking status: Former    Current packs/day: 0.00    Average packs/day: 1 pack/day for 45.0 years (45.0 ttl pk-yrs)    Types: Cigarettes    Start date: 03/14/1963    Quit date: 03/13/2008    Years since quitting: 14.8   Smokeless tobacco: Former    Quit date: 07/16/2012  Vaping Use   Vaping status: Never Used  Substance and Sexual Activity   Alcohol use: No   Drug use: No   Sexual activity: Not on file  Other Topics Concern   Not on file  Social History Narrative   Not on file   Social Determinants of Health   Financial  Resource Strain: Not on file  Food Insecurity: Not on file  Transportation Needs: Not on file  Physical Activity: Not on file  Stress: Not on file  Social Connections: Not on file  Intimate Partner Violence: Not on file    Review of Systems: See HPI, otherwise negative ROS  Physical Exam: Ht 5\' 4"  (1.626 m)   Wt 63.5 kg   BMI 24.03 kg/m  General:   Alert,  pleasant and cooperative in NAD Head:  Normocephalic and atraumatic. Lungs:  Clear to auscultation.    Heart:  Regular rate and rhythm.   Impression/Plan: [name] [name] is here for ophthalmic surgery.  Risks, benefits, limitations, and alternatives regarding ophthalmic surgery have been reviewed with the patient.  Questions have been answered.  All parties agreeable.   Lockie Mola, MD  01/17/2023, 9:01 AM

## 2023-01-17 NOTE — Anesthesia Postprocedure Evaluation (Signed)
Anesthesia Post Note  Patient: [name] [name]  Procedure(s) Performed: CATARACT EXTRACTION PHACO AND INTRAOCULAR LENS PLACEMENT (IOC) RIGHT DIABETIC 4.95 00:27.3 (Right)  Patient location during evaluation: PACU Anesthesia Type: MAC Level of consciousness: awake and alert Pain management: pain level controlled Vital Signs Assessment: post-procedure vital signs reviewed and stable Respiratory status: spontaneous breathing, nonlabored ventilation, respiratory function stable and patient connected to nasal cannula oxygen Cardiovascular status: stable and blood pressure returned to baseline Postop Assessment: no apparent nausea or vomiting Anesthetic complications: no   No notable events documented.   Last Vitals:  Vitals:   01/17/23 0944 01/17/23 0951  BP: (!) 119/59 113/62  Pulse: 67 68  Resp: 17 18  Temp: (!) 36.3 C 36.6 C  SpO2: 98% 95%    Last Pain:  Vitals:   01/17/23 0951  TempSrc:   PainSc: 0-No pain                 Marisue Humble

## 2023-01-17 NOTE — Transfer of Care (Signed)
Immediate Anesthesia Transfer of Care Note  Patient: [name] [name]  Procedure(s) Performed: CATARACT EXTRACTION PHACO AND INTRAOCULAR LENS PLACEMENT (IOC) RIGHT DIABETIC 4.95 00:27.3 (Right)  Patient Location: PACU  Anesthesia Type: MAC  Level of Consciousness: awake, alert  and patient cooperative  Airway and Oxygen Therapy: Patient Spontanous Breathing and Patient connected to supplemental oxygen  Post-op Assessment: Post-op Vital signs reviewed, Patient's Cardiovascular Status Stable, Respiratory Function Stable, Patent Airway and No signs of Nausea or vomiting  Post-op Vital Signs: Reviewed and stable  Complications: No notable events documented.

## 2023-01-18 DIAGNOSIS — H2512 Age-related nuclear cataract, left eye: Secondary | ICD-10-CM | POA: Diagnosis not present

## 2023-01-22 ENCOUNTER — Encounter: Payer: Self-pay | Admitting: Ophthalmology

## 2023-01-30 NOTE — Discharge Instructions (Signed)

## 2023-01-31 ENCOUNTER — Encounter: Payer: Self-pay | Admitting: Ophthalmology

## 2023-01-31 ENCOUNTER — Ambulatory Visit: Payer: Medicare Other | Admitting: Anesthesiology

## 2023-01-31 ENCOUNTER — Encounter
Admission: RE | Disposition: A | Discharge status: Home / Self Care | Payer: Self-pay | Source: Home / Self Care | Attending: Ophthalmology

## 2023-01-31 ENCOUNTER — Ambulatory Visit
Admission: RE | Admit: 2023-01-31 | Discharge: 2023-01-31 | Disposition: A | Discharge status: Home / Self Care | Payer: Medicare Other | Attending: Ophthalmology | Admitting: Ophthalmology

## 2023-01-31 ENCOUNTER — Other Ambulatory Visit: Payer: Self-pay

## 2023-01-31 DIAGNOSIS — E1136 Type 2 diabetes mellitus with diabetic cataract: Secondary | ICD-10-CM | POA: Diagnosis not present

## 2023-01-31 DIAGNOSIS — J439 Emphysema, unspecified: Secondary | ICD-10-CM | POA: Insufficient documentation

## 2023-01-31 DIAGNOSIS — Z7984 Long term (current) use of oral hypoglycemic drugs: Secondary | ICD-10-CM | POA: Insufficient documentation

## 2023-01-31 DIAGNOSIS — I10 Essential (primary) hypertension: Secondary | ICD-10-CM | POA: Diagnosis not present

## 2023-01-31 DIAGNOSIS — E1151 Type 2 diabetes mellitus with diabetic peripheral angiopathy without gangrene: Secondary | ICD-10-CM | POA: Diagnosis not present

## 2023-01-31 DIAGNOSIS — I2510 Atherosclerotic heart disease of native coronary artery without angina pectoris: Secondary | ICD-10-CM | POA: Insufficient documentation

## 2023-01-31 DIAGNOSIS — H2512 Age-related nuclear cataract, left eye: Secondary | ICD-10-CM | POA: Insufficient documentation

## 2023-01-31 DIAGNOSIS — H2511 Age-related nuclear cataract, right eye: Secondary | ICD-10-CM | POA: Diagnosis not present

## 2023-01-31 DIAGNOSIS — Z87891 Personal history of nicotine dependence: Secondary | ICD-10-CM | POA: Diagnosis not present

## 2023-01-31 DIAGNOSIS — J449 Chronic obstructive pulmonary disease, unspecified: Secondary | ICD-10-CM | POA: Diagnosis not present

## 2023-01-31 HISTORY — PX: CATARACT EXTRACTION W/PHACO: SHX586

## 2023-01-31 LAB — GLUCOSE, CAPILLARY: Glucose-Capillary: 150 mg/dL — ABNORMAL HIGH (ref 70–99)

## 2023-01-31 SURGERY — PHACOEMULSIFICATION, CATARACT, WITH IOL INSERTION
Anesthesia: Monitor Anesthesia Care | Laterality: Left

## 2023-01-31 MED ORDER — TETRACAINE HCL 0.5 % OP SOLN
1.0000 [drp] | OPHTHALMIC | Status: DC | PRN
  Administered 2023-01-31 (×3): 1 [drp] via OPHTHALMIC

## 2023-01-31 MED ORDER — ARMC OPHTHALMIC DILATING DROPS
OPHTHALMIC | Status: AC
  Filled 2023-01-31: qty 0.5

## 2023-01-31 MED ORDER — MIDAZOLAM HCL 2 MG/2ML IJ SOLN
INTRAMUSCULAR | Status: DC | PRN
  Administered 2023-01-31: 2 mg via INTRAVENOUS

## 2023-01-31 MED ORDER — SIGHTPATH DOSE#1 NA HYALUR & NA CHOND-NA HYALUR IO KIT
PACK | INTRAOCULAR | Status: DC | PRN
  Administered 2023-01-31: 1 via OPHTHALMIC

## 2023-01-31 MED ORDER — SIGHTPATH DOSE#1 BSS IO SOLN
INTRAOCULAR | Status: DC | PRN
  Administered 2023-01-31: 78 mL via OPHTHALMIC

## 2023-01-31 MED ORDER — SIGHTPATH DOSE#1 BSS IO SOLN
INTRAOCULAR | Status: DC | PRN
  Administered 2023-01-31: 2 mL

## 2023-01-31 MED ORDER — TETRACAINE HCL 0.5 % OP SOLN
OPHTHALMIC | Status: AC
  Filled 2023-01-31: qty 4

## 2023-01-31 MED ORDER — ARMC OPHTHALMIC DILATING DROPS
1.0000 | OPHTHALMIC | Status: DC | PRN
  Administered 2023-01-31 (×3): 1 via OPHTHALMIC

## 2023-01-31 MED ORDER — FENTANYL CITRATE (PF) 100 MCG/2ML IJ SOLN
INTRAMUSCULAR | Status: DC | PRN
  Administered 2023-01-31: 50 ug via INTRAVENOUS

## 2023-01-31 MED ORDER — BRIMONIDINE TARTRATE-TIMOLOL 0.2-0.5 % OP SOLN
OPHTHALMIC | Status: DC | PRN
  Administered 2023-01-31: 1 [drp] via OPHTHALMIC

## 2023-01-31 MED ORDER — FENTANYL CITRATE (PF) 100 MCG/2ML IJ SOLN
INTRAMUSCULAR | Status: AC
  Filled 2023-01-31: qty 2

## 2023-01-31 MED ORDER — SIGHTPATH DOSE#1 BSS IO SOLN
INTRAOCULAR | Status: DC | PRN
  Administered 2023-01-31: 15 mL via INTRAOCULAR

## 2023-01-31 MED ORDER — MIDAZOLAM HCL 2 MG/2ML IJ SOLN
INTRAMUSCULAR | Status: AC
  Filled 2023-01-31: qty 2

## 2023-01-31 MED ORDER — CEFUROXIME OPHTHALMIC INJECTION 1 MG/0.1 ML
INJECTION | OPHTHALMIC | Status: DC | PRN
  Administered 2023-01-31: 1 mg via INTRACAMERAL

## 2023-01-31 SURGICAL SUPPLY — 8 items
CATARACT SUITE SIGHTPATH (MISCELLANEOUS) ×1
FEE CATARACT SUITE SIGHTPATH (MISCELLANEOUS) ×2 IMPLANT
GLOVE SRG 8 PF TXTR STRL LF DI (GLOVE) ×2 IMPLANT
GLOVE SURG ENC TEXT LTX SZ7.5 (GLOVE) ×2 IMPLANT
LENS IOL TECNIS EYHANCE 20.5 (Intraocular Lens) IMPLANT
NDL FILTER BLUNT 18X1 1/2 (NEEDLE) ×2 IMPLANT
NEEDLE FILTER BLUNT 18X1 1/2 (NEEDLE) ×1
SYR 3ML LL SCALE MARK (SYRINGE) ×2 IMPLANT

## 2023-01-31 NOTE — Anesthesia Postprocedure Evaluation (Signed)
Anesthesia Post Note  Patient: [name] [name]  Procedure(s) Performed: CATARACT EXTRACTION PHACO AND INTRAOCULAR LENS PLACEMENT (IOC) LEFT DIABETIC 5.88 00:39.1 (Left)  Patient location during evaluation: PACU Anesthesia Type: MAC Level of consciousness: awake and alert Pain management: pain level controlled Vital Signs Assessment: post-procedure vital signs reviewed and stable Respiratory status: spontaneous breathing, nonlabored ventilation, respiratory function stable and patient connected to nasal cannula oxygen Cardiovascular status: stable and blood pressure returned to baseline Postop Assessment: no apparent nausea or vomiting Anesthetic complications: no   No notable events documented.   Last Vitals:  Vitals:   01/31/23 0957 01/31/23 1047  BP: (!) 163/75 132/65  Pulse: 84 80  Resp: 20 19  Temp: (!) 36.1 C 36.7 C  SpO2: 97% 99%    Last Pain:  Vitals:   01/31/23 1047  PainSc: 0-No pain                 Nya Monds C Shandy Vi

## 2023-01-31 NOTE — Anesthesia Preprocedure Evaluation (Signed)
Anesthesia Evaluation  Patient identified by MRN, date of birth, ID band Patient awake    Reviewed: Allergy & Precautions, H&P , NPO status , Patient's Chart, lab work & pertinent test results  Airway Mallampati: III  TM Distance: <3 FB Neck ROM: Full    Dental no notable dental hx. (+) Upper Dentures, Lower Dentures   Pulmonary neg pulmonary ROS, COPD, Patient abstained from smoking., former smoker   Pulmonary exam normal breath sounds clear to auscultation       Cardiovascular hypertension, + CAD, + Peripheral Vascular Disease and + DOE  negative cardio ROS Normal cardiovascular exam Rhythm:Regular Rate:Normal     Neuro/Psych negative neurological ROS  negative psych ROS   GI/Hepatic negative GI ROS, Neg liver ROS,,,  Endo/Other  negative endocrine ROSdiabetes    Renal/GU negative Renal ROS  negative genitourinary   Musculoskeletal negative musculoskeletal ROS (+)    Abdominal   Peds negative pediatric ROS (+)  Hematology negative hematology ROS (+)   Anesthesia Other Findings Hypertension  Hyperlipidemia COPD (chronic obstructive pulmonary disease) )  PVD (peripheral vascular disease)  Personal history of tobacco use, presenting hazards to health Emphysema of lung  Diabetes mellitus without complication Wears dentures Vertigo Motion sickness  Previous cataract surgery 01-17-23   Reproductive/Obstetrics negative OB ROS                              Anesthesia Physical Anesthesia Plan  ASA: 3  Anesthesia Plan: MAC   Post-op Pain Management:    Induction: Intravenous  PONV Risk Score and Plan:   Airway Management Planned: Natural Airway and Nasal Cannula  Additional Equipment:   Intra-op Plan:   Post-operative Plan:   Informed Consent: I have reviewed the patients History and Physical, chart, labs and discussed the procedure including the risks, benefits and  alternatives for the proposed anesthesia with the patient or authorized representative who has indicated his/her understanding and acceptance.     Dental Advisory Given  Plan Discussed with: Anesthesiologist, CRNA and Surgeon  Anesthesia Plan Comments: (Patient consented for risks of anesthesia including but not limited to:  - adverse reactions to medications - damage to eyes, teeth, lips or other oral mucosa - nerve damage due to positioning  - sore throat or hoarseness - Damage to heart, brain, nerves, lungs, other parts of body or loss of life  Patient voiced understanding and assent.)         Anesthesia Quick Evaluation

## 2023-01-31 NOTE — H&P (Signed)
[facility]   Primary Care Physician:  Ailene Ravel, MD Ophthalmologist: Dr. Lockie Mola  Pre-Procedure History & Physical: HPI:  [name] [name] is a 77 y.o. female here for ophthalmic surgery.   Past Medical History:  Diagnosis Date   COPD (chronic obstructive pulmonary disease) (HCC)    Diabetes mellitus without complication (HCC)    Emphysema of lung (HCC)    Hyperlipidemia    Hypertension    Motion sickness    planes   Personal history of tobacco use, presenting hazards to health 04/01/2015   PVD (peripheral vascular disease) (HCC)    abdominal aorta stent   Vertigo    none for over 1 year   Wears dentures    full upper and lower    Past Surgical History:  Procedure Laterality Date   ABDOMINAL AORTA STENT  12/19/2006   PTA & stent with 4x14 nitinol self-expanding stent with IBIS guidance - diagnostic cath 11/15/2006 (Dr. Erlene Quan)   ABDOMINAL HYSTERECTOMY  1980   Total   APPENDECTOMY  1967   CATARACT EXTRACTION W/PHACO Right 01/17/2023   Procedure: CATARACT EXTRACTION PHACO AND INTRAOCULAR LENS PLACEMENT (IOC) RIGHT DIABETIC 4.95 00:27.3;  Surgeon: Lockie Mola, MD;  Location: [facility];  Service: Ophthalmology;  Laterality: Right;   NM MYOCAR PERF WALL MOTION  10/2006   persantine - EF 77%, breast attenuation, low risk, normal perfusion    Prior to Admission medications   Medication Sig Start Date End Date Taking? Authorizing Provider  acetaminophen (TYLENOL) 325 MG tablet Take 325 mg by mouth every 6 (six) hours as needed.   Yes [provider]  albuterol (VENTOLIN HFA) 108 (90 Base) MCG/ACT inhaler Inhale 2 puffs into the lungs every 4 (four) hours as needed for wheezing or shortness of breath. 11/28/22  Yes Shaune Pollack, MD  aspirin EC 81 MG tablet Take 1 tablet (81 mg total) by mouth daily. 02/05/18  Yes Runell Gess, MD  Blood Glucose Calibration (ONETOUCH VERIO) High SOLN  03/28/21  Yes [provider]   clopidogrel (PLAVIX) 75 MG tablet Take 1 tablet by mouth once daily 10/07/19  Yes Runell Gess, MD  losartan (COZAAR) 50 MG tablet Take 100 mg by mouth daily. 04/02/20  Yes [provider]  metFORMIN (GLUCOPHAGE-XR) 500 MG 24 hr tablet Take 750 mg by mouth daily. 06/24/13  Yes [provider]  Multiple Vitamin (MULTIVITAMIN) capsule Take 1 capsule by mouth daily.   Yes [provider]  Omega-3 Fatty Acids (FISH OIL PO) Take by mouth daily.   Yes [provider]  [facility] VERIO test strip 1 each daily. 03/28/21  Yes [provider]  pravastatin (PRAVACHOL) 80 MG tablet Take 40 mg by mouth daily.   Yes [provider]  SPIRIVA HANDIHALER 18 MCG inhalation capsule Place 1 puff into inhaler and inhale daily as needed. 10/25/15  Yes [provider]  ezetimibe (ZETIA) 10 MG tablet Take 1 tablet (10 mg total) by mouth daily. 02/03/19 01/09/23  Runell Gess, MD    Allergies as of 12/27/2022 - Review Complete 11/28/2022  Allergen Reaction Noted   Codeine Photosensitivity 07/15/2013   Crestor [rosuvastatin] Other (See Comments) 07/15/2013   Lisinopril Cough 07/15/2013    Family History  Problem Relation Age of Onset   Stroke Mother    Lung disease Father    Heart attack Brother        in 69s   Heart disease Brother    Cancer  Maternal Aunt        Breat Cancer   Cancer Maternal Grandmother        Breast Cancer    Social History   Socioeconomic History   Marital status: Widowed    Spouse name: Not on file   Number of children: 1   Years of education: Not on file   Highest education level: Not on file  Occupational History   Not on file  Tobacco Use   Smoking status: Former    Current packs/day: 0.00    Average packs/day: 1 pack/day for 45.0 years (45.0 ttl pk-yrs)    Types: Cigarettes    Start date: 03/14/1963    Quit date: 03/13/2008    Years since quitting: 14.8   Smokeless tobacco: Former    Quit date: 07/16/2012   Vaping Use   Vaping status: Never Used  Substance and Sexual Activity   Alcohol use: No   Drug use: No   Sexual activity: Not on file  Other Topics Concern   Not on file  Social History Narrative   Not on file   Social Determinants of Health   Financial Resource Strain: Not on file  Food Insecurity: Not on file  Transportation Needs: Not on file  Physical Activity: Not on file  Stress: Not on file  Social Connections: Not on file  Intimate Partner Violence: Not on file    Review of Systems: See HPI, otherwise negative ROS  Physical Exam: BP (!) 163/75   Pulse 84   Temp (!) 97 F (36.1 C)   Resp 20   Ht 5\' 4"  (1.626 m)   Wt 62.6 kg   SpO2 97%   BMI 23.69 kg/m  General:   Alert,  pleasant and cooperative in NAD Head:  Normocephalic and atraumatic. Lungs:  Clear to auscultation.    Heart:  Regular rate and rhythm.   Impression/Plan: [name] [name] is here for ophthalmic surgery.  Risks, benefits, limitations, and alternatives regarding ophthalmic surgery have been reviewed with the patient.  Questions have been answered.  All parties agreeable.   Lockie Mola, MD  01/31/2023, 10:05 AM

## 2023-01-31 NOTE — Transfer of Care (Signed)
Immediate Anesthesia Transfer of Care Note  Patient: [name] [name]  Procedure(s) Performed: CATARACT EXTRACTION PHACO AND INTRAOCULAR LENS PLACEMENT (IOC) LEFT DIABETIC 5.88 00:39.1 (Left)  Patient Location: PACU  Anesthesia Type: MAC  Level of Consciousness: awake, alert  and patient cooperative  Airway and Oxygen Therapy: Patient Spontanous Breathing and Patient connected to supplemental oxygen  Post-op Assessment: Post-op Vital signs reviewed, Patient's Cardiovascular Status Stable, Respiratory Function Stable, Patent Airway and No signs of Nausea or vomiting  Post-op Vital Signs: Reviewed and stable  Complications: No notable events documented.

## 2023-01-31 NOTE — Op Note (Signed)
OPERATIVE NOTE  [name] [name] [id number] 01/31/2023   PREOPERATIVE DIAGNOSIS:  Nuclear sclerotic cataract left eye. H25.12   POSTOPERATIVE DIAGNOSIS:    Nuclear sclerotic cataract left eye.     PROCEDURE:  Phacoemusification with posterior chamber intraocular lens placement of the left eye  Ultrasound time: Procedure(s): CATARACT EXTRACTION PHACO AND INTRAOCULAR LENS PLACEMENT (IOC) LEFT DIABETIC 5.88 00:39.1 (Left)  LENS:   Implant Name Type Inv. Item Serial No. Manufacturer Lot No. LRB No. Used Action  LENS IOL TECNIS EYHANCE 20.5 - [id number] Intraocular Lens LENS IOL TECNIS EYHANCE 20.5 [id number] [employer]  Left 1 Implanted      SURGEON:  Deirdre Evener, MD   ANESTHESIA:  Topical with tetracaine drops and 2% Xylocaine jelly, augmented with 1% preservative-free intracameral lidocaine.    COMPLICATIONS:  None.   DESCRIPTION OF PROCEDURE:  The patient was identified in the holding room and transported to the operating room and placed in the supine position under the operating microscope.  The left eye was identified as the operative eye and it was prepped and draped in the usual sterile ophthalmic fashion.   A 1 millimeter clear-corneal paracentesis was made at the 1:30 position.  0.5 ml of preservative-free 1% lidocaine was injected into the anterior chamber.  The anterior chamber was filled with Viscoat viscoelastic.  A 2.4 millimeter keratome was used to make a near-clear corneal incision at the 10:30 position.  .  A curvilinear capsulorrhexis was made with a cystotome and capsulorrhexis forceps.  Balanced salt solution was used to hydrodissect and hydrodelineate the nucleus.   Phacoemulsification was then used in stop and chop fashion to remove the lens nucleus and epinucleus.  The remaining cortex was then removed using the irrigation and aspiration handpiece. Provisc was then placed into the capsular bag to distend it for lens placement.  A lens was then injected into  the capsular bag.  The remaining viscoelastic was aspirated.   Wounds were hydrated with balanced salt solution.  The anterior chamber was inflated to a physiologic pressure with balanced salt solution.  No wound leaks were noted. Cefuroxime 0.1 ml of a 10mg /ml solution was injected into the anterior chamber for a dose of 1 mg of intracameral antibiotic at the completion of the case.   Timolol and Brimonidine drops were applied to the eye.  The patient was taken to the recovery room in stable condition without complications of anesthesia or surgery.  Corrisa Gibby 01/31/2023, 10:46 AM

## 2023-02-01 ENCOUNTER — Encounter: Payer: Self-pay | Admitting: Ophthalmology

## 2023-02-20 DIAGNOSIS — Z961 Presence of intraocular lens: Secondary | ICD-10-CM | POA: Diagnosis not present

## 2023-02-21 DIAGNOSIS — Z9181 History of falling: Secondary | ICD-10-CM | POA: Diagnosis not present

## 2023-02-21 DIAGNOSIS — Z1231 Encounter for screening mammogram for malignant neoplasm of breast: Secondary | ICD-10-CM | POA: Diagnosis not present

## 2023-02-21 DIAGNOSIS — Z0000 Encounter for general adult medical examination without abnormal findings: Secondary | ICD-10-CM | POA: Diagnosis not present

## 2023-02-21 DIAGNOSIS — Z139 Encounter for screening, unspecified: Secondary | ICD-10-CM | POA: Diagnosis not present

## 2023-04-16 DIAGNOSIS — M8588 Other specified disorders of bone density and structure, other site: Secondary | ICD-10-CM | POA: Diagnosis not present

## 2023-04-16 DIAGNOSIS — Z1231 Encounter for screening mammogram for malignant neoplasm of breast: Secondary | ICD-10-CM | POA: Diagnosis not present

## 2023-05-04 ENCOUNTER — Other Ambulatory Visit: Payer: Self-pay | Admitting: Cardiovascular Disease

## 2023-05-04 DIAGNOSIS — I739 Peripheral vascular disease, unspecified: Secondary | ICD-10-CM

## 2023-05-29 ENCOUNTER — Encounter (HOSPITAL_COMMUNITY): Payer: Medicare Other

## 2023-06-04 ENCOUNTER — Ambulatory Visit (HOSPITAL_COMMUNITY): Payer: Medicare Other

## 2023-06-04 ENCOUNTER — Ambulatory Visit (HOSPITAL_COMMUNITY): Admission: RE | Admit: 2023-06-04 | Payer: Medicare Other | Source: Ambulatory Visit

## 2023-06-18 ENCOUNTER — Ambulatory Visit: Payer: Medicare Other | Attending: Cardiovascular Disease | Admitting: Cardiovascular Disease

## 2023-06-18 ENCOUNTER — Encounter: Payer: Self-pay | Admitting: Cardiovascular Disease

## 2023-06-18 VITALS — BP 159/71 | HR 79 | Ht 64.5 in | Wt 143.0 lb

## 2023-06-18 DIAGNOSIS — I10 Essential (primary) hypertension: Secondary | ICD-10-CM

## 2023-06-18 DIAGNOSIS — R0609 Other forms of dyspnea: Secondary | ICD-10-CM

## 2023-06-18 DIAGNOSIS — I2510 Atherosclerotic heart disease of native coronary artery without angina pectoris: Secondary | ICD-10-CM

## 2023-06-18 DIAGNOSIS — E782 Mixed hyperlipidemia: Secondary | ICD-10-CM | POA: Diagnosis not present

## 2023-06-18 DIAGNOSIS — I739 Peripheral vascular disease, unspecified: Secondary | ICD-10-CM

## 2023-06-18 NOTE — Patient Instructions (Signed)
 Medication Instructions:  Your physician recommends that you continue on your current medications as directed. Please refer to the Current Medication list given to you today.  *If you need a refill on your cardiac medications before your next appointment, please call your pharmacy*   Testing/Procedures: Your physician has requested that you have an echocardiogram. Echocardiography is a painless test that uses sound waves to create images of your heart. It provides your doctor with information about the size and shape of your heart and how well your heart's chambers and valves are working. This procedure takes approximately one hour. There are no restrictions for this procedure. Please do NOT wear cologne, perfume, aftershave, or lotions (deodorant is allowed). Please arrive 15 minutes prior to your appointment time.  Please note: We ask at that you not bring children with you during ultrasound (echo/ vascular) testing. Due to room size and safety concerns, children are not allowed in the ultrasound rooms during exams. Our front office staff cannot provide observation of children in our lobby area while testing is being conducted. An adult accompanying a patient to their appointment will only be allowed in the ultrasound room at the discretion of the ultrasound technician under special circumstances. We apologize for any inconvenience.   Follow-Up: At [facility], you and your health needs are our priority.  As part of our continuing mission to provide you with exceptional heart care, our providers are all part of one team.  This team includes your primary Cardiologist (physician) and Advanced Practice Providers or APPs (Physician Assistants and Nurse Practitioners) who all work together to provide you with the care you need, when you need it.  Your next appointment:   12 month(s)  Provider:   Nanetta Batty, MD    We recommend signing up for the patient portal called "MyChart".   Sign up information is provided on this After Visit Summary.  MyChart is used to connect with patients for Virtual Visits (Telemedicine).  Patients are able to view lab/test results, encounter notes, upcoming appointments, etc.  Non-urgent messages can be sent to your provider as well.   To learn more about what you can do with MyChart, go to [email or web address].   Other Instructions       1st Floor: - Lobby - Registration  - Pharmacy  - Lab - Cafe  2nd Floor: - PV Lab - Diagnostic Testing (echo, CT, nuclear med)  3rd Floor: - Vacant  4th Floor: - TCTS (cardiothoracic surgery) - AFib Clinic - Structural Heart Clinic - Vascular Surgery  - Vascular Ultrasound  5th Floor: - HeartCare Cardiology (general and EP) - Clinical Pharmacy for coumadin, hypertension, lipid, weight-loss medications, and med management appointments    Valet parking services will be available as well.

## 2023-06-18 NOTE — Progress Notes (Signed)
 06/18/2023 [name] [name]   [birth year]  [id number]  Primary Physician Hamrick, Durward Fortes, MD Primary Cardiologist: Runell Gess MD Nicholes Calamity, [place]  HPI:  [name] [name] is a 78 y.o.    thin-appearing, widowed Caucasian female, mother of 1 child, grandmother to 2 grandchildren who I last saw in the office 05/29/2022.  She has a history of discontinued tobacco abuse, having stopped on September 10, 2009. She did have 45-pack-year history prior to that. She has hypertension and hyperlipidemia as well. Because of claudication, I angiogram'd her on November 15, 2006, revealing a distal abdominal aortic flap which I ultimately stented 1 month later with a 4-x-14 SMART Nitinol self-expanding stent. Followup Dopplers were normal and her claudication resolved. Dr. Nathanial Rancher follows her lipid profile. She does get short of breath probably related to COPD. She had a Myoview stress test performed in 2008 which was normal.    A chest CT done for cancer screening did mention coronary calcification and as a result she had a    Since I saw her a year ago she is remained stable.  She continues to complain of dyspnea.  She did have COVID 13 March 2022 which she is only slowly recovering from however she still complains of dyspnea and chronic fatigue.  She specifically denies chest pain.   Current Meds  Medication Sig   acetaminophen (TYLENOL) 325 MG tablet Take 325 mg by mouth every 6 (six) hours as needed.   albuterol (VENTOLIN HFA) 108 (90 Base) MCG/ACT inhaler Inhale 2 puffs into the lungs every 4 (four) hours as needed for wheezing or shortness of breath.   aspirin EC 81 MG tablet Take 1 tablet (81 mg total) by mouth daily.   Blood Glucose Calibration (ONETOUCH VERIO) High SOLN    clopidogrel (PLAVIX) 75 MG tablet Take 1 tablet by mouth once daily   ezetimibe (ZETIA) 10 MG tablet Take 1 tablet (10 mg total) by mouth daily.   losartan (COZAAR) 50 MG tablet Take 100 mg by mouth daily.   metFORMIN  (GLUCOPHAGE-XR) 500 MG 24 hr tablet Take 750 mg by mouth daily.   Multiple Vitamin (MULTIVITAMIN) capsule Take 1 capsule by mouth daily.   Omega-3 Fatty Acids (FISH OIL PO) Take by mouth daily.   ONETOUCH VERIO test strip 1 each daily.   pravastatin (PRAVACHOL) 80 MG tablet Take 40 mg by mouth daily.   SPIRIVA HANDIHALER 18 MCG inhalation capsule Place 1 puff into inhaler and inhale daily as needed.     Allergies  Allergen Reactions   Codeine Photosensitivity   Crestor [Rosuvastatin] Other (See Comments)    Muscle pain   Lisinopril Cough    Social History   Socioeconomic History   Marital status: Widowed    Spouse name: Not on file   Number of children: 1   Years of education: Not on file   Highest education level: Not on file  Occupational History   Not on file  Tobacco Use   Smoking status: Former    Current packs/day: 0.00    Average packs/day: 1 pack/day for 45.0 years (45.0 ttl pk-yrs)    Types: Cigarettes    Start date: 03/14/1963    Quit date: 03/13/2008    Years since quitting: 15.2   Smokeless tobacco: Former    Quit date: 07/16/2012  Vaping Use   Vaping status: Never Used  Substance and Sexual Activity   Alcohol use: No   Drug use: No  Sexual activity: Not on file  Other Topics Concern   Not on file  Social History Narrative   Not on file   Social Drivers of Health   Financial Resource Strain: Not on file  Food Insecurity: Not on file  Transportation Needs: Not on file  Physical Activity: Not on file  Stress: Not on file  Social Connections: Not on file  Intimate Partner Violence: Not on file     Review of Systems: General: negative for chills, fever, night sweats or weight changes.  Cardiovascular: negative for chest pain, dyspnea on exertion, edema, orthopnea, palpitations, paroxysmal nocturnal dyspnea or shortness of breath Dermatological: negative for rash Respiratory: negative for cough or wheezing Urologic: negative for hematuria Abdominal:  negative for nausea, vomiting, diarrhea, bright red blood per rectum, melena, or hematemesis Neurologic: negative for visual changes, syncope, or dizziness All other systems reviewed and are otherwise negative except as noted above.    Blood pressure (!) 159/71, pulse 79, height 5' 4.5" (1.638 m), weight 143 lb (64.9 kg), SpO2 91%.  General appearance: alert and no distress Neck: no adenopathy, no JVD, supple, symmetrical, trachea midline, thyroid not enlarged, symmetric, no tenderness/mass/nodules, and soft left carotid bruit Lungs: clear to auscultation bilaterally Heart: regular rate and rhythm, S1, S2 normal, no murmur, click, rub or gallop Extremities: extremities normal, atraumatic, no cyanosis or edema Pulses: 2+ and symmetric Skin: Skin color, texture, turgor normal. No rashes or lesions Neurologic: Grossly normal  EKG EKG Interpretation Date/Time:  Monday June 18 2023 11:44:06 EDT Ventricular Rate:  73 PR Interval:  188 QRS Duration:  100 QT Interval:  382 QTC Calculation: 420 R Axis:   28  Text Interpretation: Normal sinus rhythm Normal ECG When compared with ECG of 28-Nov-2022 11:20, Criteria for Septal infarct are no longer Present Confirmed by Nanetta Batty [phone]) on 06/18/2023 12:10:59 PM    ASSESSMENT AND PLAN:   Peripheral arterial disease (HCC) History of PAD status post distal abdominal aortic flap which I successfully stented using a 4 cm x 14 mm Smart self-expanding stent 9//08.  This resulted in resolution of her claudication and improvement in her Doppler studies.  Her most recent Dopplers performed 05/08/2022 revealed normal ABIs bilaterally with a patent distal abdominal aorta.  We will repeat these in 12 months.  Essential hypertension History of essential hypertension with blood pressure measured today at 159/71.  She is on losartan.  Hyperlipidemia History of hyperlipidemia on Zetia and Pravachol with lipid profile performed 12/29/2022 revealing a  total cholesterol 129, LDL 51 and HDL 59.  CAD (coronary artery disease) History of coronary calcification seen on chest CT with a negative Myoview stress test 05/05/2020.  She denies chest pain but does complain of dyspnea on exertion.  This may be related to her long history of tobacco abuse having stopped 09/11/2019  COVID last year.  I am going to check a 2D echo to further evaluate.     Runell Gess MD FACP,FACC,FAHA, [facility] 06/18/2023 12:19 PM

## 2023-06-18 NOTE — Assessment & Plan Note (Signed)
 History of PAD status post distal abdominal aortic flap which I successfully stented using a 4 cm x 14 mm Smart self-expanding stent 9//08.  This resulted in resolution of her claudication and improvement in her Doppler studies.  Her most recent Dopplers performed 05/08/2022 revealed normal ABIs bilaterally with a patent distal abdominal aorta.  We will repeat these in 12 months.

## 2023-06-18 NOTE — Assessment & Plan Note (Signed)
 History of hyperlipidemia on Zetia and Pravachol with lipid profile performed 12/29/2022 revealing a total cholesterol 129, LDL 51 and HDL 59.

## 2023-06-18 NOTE — Assessment & Plan Note (Signed)
 History of coronary calcification seen on chest CT with a negative Myoview stress test 05/05/2020.  She denies chest pain but does complain of dyspnea on exertion.  This may be related to her long history of tobacco abuse having stopped 09/11/2019  COVID last year.  I am going to check a 2D echo to further evaluate.

## 2023-06-18 NOTE — Assessment & Plan Note (Signed)
 History of essential hypertension with blood pressure measured today at 159/71.  She is on losartan.

## 2023-06-21 ENCOUNTER — Encounter (HOSPITAL_COMMUNITY): Payer: Self-pay

## 2023-06-25 DIAGNOSIS — R0789 Other chest pain: Secondary | ICD-10-CM | POA: Diagnosis not present

## 2023-06-25 DIAGNOSIS — J439 Emphysema, unspecified: Secondary | ICD-10-CM | POA: Diagnosis not present

## 2023-06-25 DIAGNOSIS — R059 Cough, unspecified: Secondary | ICD-10-CM | POA: Diagnosis not present

## 2023-06-29 ENCOUNTER — Encounter (HOSPITAL_COMMUNITY)

## 2023-07-06 DIAGNOSIS — Z79899 Other long term (current) drug therapy: Secondary | ICD-10-CM | POA: Diagnosis not present

## 2023-07-06 DIAGNOSIS — J439 Emphysema, unspecified: Secondary | ICD-10-CM | POA: Diagnosis not present

## 2023-07-06 DIAGNOSIS — D649 Anemia, unspecified: Secondary | ICD-10-CM | POA: Diagnosis not present

## 2023-07-06 DIAGNOSIS — I10 Essential (primary) hypertension: Secondary | ICD-10-CM | POA: Diagnosis not present

## 2023-07-06 DIAGNOSIS — E782 Mixed hyperlipidemia: Secondary | ICD-10-CM | POA: Diagnosis not present

## 2023-07-06 DIAGNOSIS — I739 Peripheral vascular disease, unspecified: Secondary | ICD-10-CM | POA: Diagnosis not present

## 2023-07-06 DIAGNOSIS — E1129 Type 2 diabetes mellitus with other diabetic kidney complication: Secondary | ICD-10-CM | POA: Diagnosis not present

## 2023-07-06 DIAGNOSIS — E871 Hypo-osmolality and hyponatremia: Secondary | ICD-10-CM | POA: Diagnosis not present

## 2023-07-06 DIAGNOSIS — R809 Proteinuria, unspecified: Secondary | ICD-10-CM | POA: Diagnosis not present

## 2023-07-06 DIAGNOSIS — R7989 Other specified abnormal findings of blood chemistry: Secondary | ICD-10-CM | POA: Diagnosis not present

## 2023-07-13 ENCOUNTER — Other Ambulatory Visit: Payer: Self-pay | Admitting: Family Medicine

## 2023-07-13 DIAGNOSIS — Z87891 Personal history of nicotine dependence: Secondary | ICD-10-CM

## 2023-07-16 DIAGNOSIS — Z1212 Encounter for screening for malignant neoplasm of rectum: Secondary | ICD-10-CM | POA: Diagnosis not present

## 2023-07-16 DIAGNOSIS — D509 Iron deficiency anemia, unspecified: Secondary | ICD-10-CM | POA: Diagnosis not present

## 2023-07-17 ENCOUNTER — Ambulatory Visit
Admission: RE | Admit: 2023-07-17 | Discharge: 2023-07-17 | Disposition: A | Discharge status: Home / Self Care | Source: Ambulatory Visit | Attending: Family Medicine | Admitting: Family Medicine

## 2023-07-17 DIAGNOSIS — Z87891 Personal history of nicotine dependence: Secondary | ICD-10-CM | POA: Diagnosis not present

## 2023-07-20 DIAGNOSIS — E871 Hypo-osmolality and hyponatremia: Secondary | ICD-10-CM | POA: Diagnosis not present

## 2023-07-20 DIAGNOSIS — D509 Iron deficiency anemia, unspecified: Secondary | ICD-10-CM | POA: Diagnosis not present

## 2023-07-24 ENCOUNTER — Ambulatory Visit (HOSPITAL_COMMUNITY): Admission: RE | Admit: 2023-07-24 | Source: Ambulatory Visit

## 2023-07-24 DIAGNOSIS — N390 Urinary tract infection, site not specified: Secondary | ICD-10-CM | POA: Diagnosis not present

## 2023-08-07 DIAGNOSIS — D509 Iron deficiency anemia, unspecified: Secondary | ICD-10-CM | POA: Diagnosis not present

## 2023-08-13 ENCOUNTER — Ambulatory Visit (HOSPITAL_COMMUNITY)
Admission: RE | Admit: 2023-08-13 | Discharge: 2023-08-13 | Disposition: A | Discharge status: Home / Self Care | Source: Ambulatory Visit | Attending: Cardiovascular Disease | Admitting: Cardiovascular Disease

## 2023-08-13 DIAGNOSIS — I10 Essential (primary) hypertension: Secondary | ICD-10-CM | POA: Diagnosis not present

## 2023-08-13 DIAGNOSIS — E782 Mixed hyperlipidemia: Secondary | ICD-10-CM | POA: Diagnosis not present

## 2023-08-13 DIAGNOSIS — R0609 Other forms of dyspnea: Secondary | ICD-10-CM | POA: Diagnosis not present

## 2023-08-13 DIAGNOSIS — I2510 Atherosclerotic heart disease of native coronary artery without angina pectoris: Secondary | ICD-10-CM | POA: Diagnosis not present

## 2023-08-14 DIAGNOSIS — D509 Iron deficiency anemia, unspecified: Secondary | ICD-10-CM | POA: Diagnosis not present

## 2023-08-14 DIAGNOSIS — R195 Other fecal abnormalities: Secondary | ICD-10-CM | POA: Diagnosis not present

## 2023-08-14 LAB — ECHOCARDIOGRAM COMPLETE
AR max vel: 2.35 cm2
AV Area VTI: 2.32 cm2
AV Area mean vel: 2.35 cm2
AV Mean grad: 2 mmHg
AV Peak grad: 3.6 mmHg
Ao pk vel: 0.94 m/s
Area-P 1/2: 4.8 cm2
S' Lateral: 2.66 cm

## 2023-08-16 ENCOUNTER — Ambulatory Visit: Payer: Self-pay | Admitting: Cardiovascular Disease

## 2023-08-21 DIAGNOSIS — Z961 Presence of intraocular lens: Secondary | ICD-10-CM | POA: Diagnosis not present

## 2023-08-21 DIAGNOSIS — E119 Type 2 diabetes mellitus without complications: Secondary | ICD-10-CM | POA: Diagnosis not present

## 2023-08-21 DIAGNOSIS — H43813 Vitreous degeneration, bilateral: Secondary | ICD-10-CM | POA: Diagnosis not present

## 2023-08-21 DIAGNOSIS — H40003 Preglaucoma, unspecified, bilateral: Secondary | ICD-10-CM | POA: Diagnosis not present

## 2023-09-21 ENCOUNTER — Telehealth: Payer: Self-pay

## 2023-09-21 NOTE — Telephone Encounter (Signed)
   Patient Name: [name] [name]  DOB: 12-05-1945 MRN: [id number]  Primary Cardiologist: None  Chart reviewed as part of pre-operative protocol coverage. Given past medical history and time since last visit, based on ACC/AHA guidelines, [name] [name] is at acceptable risk for the planned procedure without further cardiovascular testing.   Patient may hold the Plavix  for 5 days prior to the upcoming procedure and restart as soon as possible afterward at the surgeon's discretion.  The patient was advised that if she develops new symptoms prior to surgery to contact our office to arrange for a follow-up visit, and she verbalized understanding.  I will route this recommendation to the requesting party via Epic fax function and remove from pre-op pool.  Please call with questions.  Mariame Rybolt, [place] 09/21/2023, 12:04 PM

## 2023-09-21 NOTE — Telephone Encounter (Signed)
   Pre-operative Risk Assessment    Patient Name: [name] [name]  DOB: 09/24/1945 MRN: [id number]   Date of last office visit: 06/18/23 Date of next office visit: Not scheduled   Request for Surgical Clearance    Procedure:  Colonoscopy and Upper Endoscopy  Date of Surgery:  Clearance 10/22/23                                Surgeon:  Dr. Ole Schick Surgeon's Group or Practice Name:  [facility] Phone number:  [phone] Fax number:  [phone]   Type of Clearance Requested:   - Medical  - Pharmacy:  Hold Clopidogrel  (Plavix ) x5 days   Type of Anesthesia:  Not Indicated   Additional requests/questions:    [name] [name]   09/21/2023, 7:41 AM

## 2023-09-21 NOTE — Telephone Encounter (Signed)
 Dr. Court to review, patient was seen fairly recently and had a reassuring echocardiogram.  She has a prior history of PAD but no prior diagnosis of CAD. Last stress test was obtained in February 2022.  Upcoming colonoscopy is low risk.

## 2023-10-22 ENCOUNTER — Ambulatory Visit: Admitting: Certified Registered"

## 2023-10-22 ENCOUNTER — Encounter
Admission: RE | Disposition: A | Discharge status: Home / Self Care | Payer: Self-pay | Source: Home / Self Care | Attending: Gastroenterology

## 2023-10-22 ENCOUNTER — Ambulatory Visit
Admission: RE | Admit: 2023-10-22 | Discharge: 2023-10-22 | Disposition: A | Discharge status: Home / Self Care | Attending: Gastroenterology | Admitting: Gastroenterology

## 2023-10-22 DIAGNOSIS — D125 Benign neoplasm of sigmoid colon: Secondary | ICD-10-CM | POA: Diagnosis not present

## 2023-10-22 DIAGNOSIS — Z79899 Other long term (current) drug therapy: Secondary | ICD-10-CM | POA: Diagnosis not present

## 2023-10-22 DIAGNOSIS — Z87891 Personal history of nicotine dependence: Secondary | ICD-10-CM | POA: Diagnosis not present

## 2023-10-22 DIAGNOSIS — Z7902 Long term (current) use of antithrombotics/antiplatelets: Secondary | ICD-10-CM | POA: Diagnosis not present

## 2023-10-22 DIAGNOSIS — K635 Polyp of colon: Secondary | ICD-10-CM | POA: Diagnosis not present

## 2023-10-22 DIAGNOSIS — I10 Essential (primary) hypertension: Secondary | ICD-10-CM | POA: Diagnosis not present

## 2023-10-22 DIAGNOSIS — K2950 Unspecified chronic gastritis without bleeding: Secondary | ICD-10-CM | POA: Diagnosis not present

## 2023-10-22 DIAGNOSIS — B9681 Helicobacter pylori [H. pylori] as the cause of diseases classified elsewhere: Secondary | ICD-10-CM | POA: Diagnosis not present

## 2023-10-22 DIAGNOSIS — E1151 Type 2 diabetes mellitus with diabetic peripheral angiopathy without gangrene: Secondary | ICD-10-CM | POA: Diagnosis not present

## 2023-10-22 DIAGNOSIS — Z7984 Long term (current) use of oral hypoglycemic drugs: Secondary | ICD-10-CM | POA: Insufficient documentation

## 2023-10-22 DIAGNOSIS — J439 Emphysema, unspecified: Secondary | ICD-10-CM | POA: Insufficient documentation

## 2023-10-22 DIAGNOSIS — D509 Iron deficiency anemia, unspecified: Secondary | ICD-10-CM | POA: Diagnosis not present

## 2023-10-22 DIAGNOSIS — E785 Hyperlipidemia, unspecified: Secondary | ICD-10-CM | POA: Diagnosis not present

## 2023-10-22 DIAGNOSIS — I2510 Atherosclerotic heart disease of native coronary artery without angina pectoris: Secondary | ICD-10-CM | POA: Insufficient documentation

## 2023-10-22 DIAGNOSIS — K640 First degree hemorrhoids: Secondary | ICD-10-CM | POA: Insufficient documentation

## 2023-10-22 DIAGNOSIS — K449 Diaphragmatic hernia without obstruction or gangrene: Secondary | ICD-10-CM | POA: Diagnosis not present

## 2023-10-22 DIAGNOSIS — K5730 Diverticulosis of large intestine without perforation or abscess without bleeding: Secondary | ICD-10-CM | POA: Insufficient documentation

## 2023-10-22 DIAGNOSIS — K2971 Gastritis, unspecified, with bleeding: Secondary | ICD-10-CM | POA: Diagnosis not present

## 2023-10-22 DIAGNOSIS — K31A19 Gastric intestinal metaplasia without dysplasia, unspecified site: Secondary | ICD-10-CM | POA: Diagnosis not present

## 2023-10-22 DIAGNOSIS — K3189 Other diseases of stomach and duodenum: Secondary | ICD-10-CM | POA: Diagnosis not present

## 2023-10-22 DIAGNOSIS — K5790 Diverticulosis of intestine, part unspecified, without perforation or abscess without bleeding: Secondary | ICD-10-CM | POA: Diagnosis not present

## 2023-10-22 HISTORY — PX: ESOPHAGOGASTRODUODENOSCOPY: SHX5428

## 2023-10-22 HISTORY — PX: POLYPECTOMY: SHX149

## 2023-10-22 HISTORY — PX: COLONOSCOPY: SHX5424

## 2023-10-22 LAB — GLUCOSE, CAPILLARY: Glucose-Capillary: 158 mg/dL — ABNORMAL HIGH (ref 70–99)

## 2023-10-22 SURGERY — COLONOSCOPY
Anesthesia: General

## 2023-10-22 MED ORDER — SODIUM CHLORIDE 0.9 % IV SOLN
INTRAVENOUS | Status: DC
Start: 2023-10-22 — End: 2023-10-22

## 2023-10-22 MED ORDER — LIDOCAINE HCL (CARDIAC) PF 100 MG/5ML IV SOSY
PREFILLED_SYRINGE | INTRAVENOUS | Status: DC | PRN
  Administered 2023-10-22 (×2): 50 mg via INTRAVENOUS

## 2023-10-22 MED ORDER — PROPOFOL 10 MG/ML IV BOLUS
INTRAVENOUS | Status: DC | PRN
  Administered 2023-10-22: 50 mg via INTRAVENOUS
  Administered 2023-10-22 (×2): 20 mg via INTRAVENOUS
  Administered 2023-10-22: 50 mg via INTRAVENOUS

## 2023-10-22 MED ORDER — PROPOFOL 500 MG/50ML IV EMUL
INTRAVENOUS | Status: DC | PRN
  Administered 2023-10-22 (×2): 150 ug/kg/min via INTRAVENOUS

## 2023-10-22 NOTE — Anesthesia Preprocedure Evaluation (Signed)
 Anesthesia Evaluation  Patient identified by MRN, date of birth, ID band Patient awake    Reviewed: Allergy & Precautions, H&P , NPO status , Patient's Chart, lab work & pertinent test results, reviewed documented beta blocker date and time   Airway Mallampati: II   Neck ROM: full    Dental  (+) Poor Dentition   Pulmonary COPD, former smoker   Pulmonary exam normal        Cardiovascular Exercise Tolerance: Poor hypertension, On Medications + CAD  Normal cardiovascular exam Rhythm:regular Rate:Normal     Neuro/Psych negative neurological ROS  negative psych ROS   GI/Hepatic negative GI ROS, Neg liver ROS,,,  Endo/Other  negative endocrine ROSdiabetes, Type 2, Oral Hypoglycemic Agents    Renal/GU negative Renal ROS  negative genitourinary   Musculoskeletal   Abdominal   Peds  Hematology negative hematology ROS (+)   Anesthesia Other Findings Past Medical History: No date: COPD (chronic obstructive pulmonary disease) (HCC) No date: Diabetes mellitus without complication (HCC) No date: Emphysema of lung (HCC) No date: Hyperlipidemia No date: Hypertension No date: Motion sickness     Comment:  planes 04/01/2015: Personal history of tobacco use, presenting hazards to  health No date: PVD (peripheral vascular disease) (HCC)     Comment:  abdominal aorta stent No date: Vertigo     Comment:  none for over 1 year No date: Wears dentures     Comment:  full upper and lower Past Surgical History: 12/19/2006: ABDOMINAL AORTA STENT     Comment:  PTA & stent with 4x14 nitinol self-expanding stent with               IBIS guidance - diagnostic cath 11/15/2006 (Dr. DOROTHA Lesches) 1980: ABDOMINAL HYSTERECTOMY     Comment:  Total 1967: APPENDECTOMY 01/17/2023: CATARACT EXTRACTION W/PHACO; Right     Comment:  Procedure: CATARACT EXTRACTION PHACO AND INTRAOCULAR               LENS PLACEMENT (IOC) RIGHT DIABETIC 4.95 00:27.3;                 Surgeon: Mittie Gaskin, MD;  Location: [facility]               [facility];  Service: Ophthalmology;  Laterality:               Right; 01/31/2023: CATARACT EXTRACTION W/PHACO; Left     Comment:  Procedure: CATARACT EXTRACTION PHACO AND INTRAOCULAR               LENS PLACEMENT (IOC) LEFT DIABETIC 5.88 00:39.1;                Surgeon: Mittie Gaskin, MD;  Location: [facility]               [facility];  Service: Ophthalmology;  Laterality: Left; 10/2006: NM MYOCAR PERF WALL MOTION     Comment:  persantine - EF 77%, breast attenuation, low risk,               normal perfusion BMI    Body Mass Index: 23.32 kg/m     Reproductive/Obstetrics negative OB ROS                              Anesthesia Physical Anesthesia Plan  ASA: 3  Anesthesia Plan: General   Post-op Pain Management:    Induction:   PONV Risk Score and Plan:   Airway Management Planned:  Additional Equipment:   Intra-op Plan:   Post-operative Plan:   Informed Consent: I have reviewed the patients History and Physical, chart, labs and discussed the procedure including the risks, benefits and alternatives for the proposed anesthesia with the patient or authorized representative who has indicated his/her understanding and acceptance.     Dental Advisory Given  Plan Discussed with: CRNA  Anesthesia Plan Comments:         Anesthesia Quick Evaluation

## 2023-10-22 NOTE — Interval H&P Note (Signed)
 History and Physical Interval Note:  10/22/2023 12:11 PM  [name] [name]  has presented today for surgery, with the diagnosis of IDA.  The various methods of treatment have been discussed with the patient and family. After consideration of risks, benefits and other options for treatment, the patient has consented to  Procedure(s) with comments: COLONOSCOPY (N/A) - DM EGD (ESOPHAGOGASTRODUODENOSCOPY) (N/A) as a surgical intervention.  The patient's history has been reviewed, patient examined, no change in status, stable for surgery.  I have reviewed the patient's chart and labs.  Questions were answered to the patient's satisfaction.     Ole ONEIDA Schick  Ok to proceed with EGD/Colonoscopy

## 2023-10-22 NOTE — Op Note (Signed)
 [facility] Gastroenterology Patient Name: [name] [name] Procedure Date: 10/22/2023 12:11 PM MRN: [id number] Account #: [id number] Date of Birth: [birth year] Admit Type: Outpatient Age: 78 Room: [facility] ENDO ROOM 3 Gender: Female Note Status: Finalized Instrument Name: Barnie GI Scope [id number] Procedure:             Upper GI endoscopy Indications:           Iron deficiency anemia Providers:             Ole Schick MD, MD Referring MD:          Charlene CROME. Hamrick (Referring MD) Medicines:             Monitored Anesthesia Care Complications:         No immediate complications. Estimated blood loss:                         Minimal. Procedure:             Pre-Anesthesia Assessment:                        - Prior to the procedure, a History and Physical was                         performed, and patient medications and allergies were                         reviewed. The patient is competent. The risks and                         benefits of the procedure and the sedation options and                         risks were discussed with the patient. All questions                         were answered and informed consent was obtained.                         Patient identification and proposed procedure were                         verified by the physician, the nurse, the                         anesthesiologist, the anesthetist and the technician                         in the endoscopy suite. Mental Status Examination:                         alert and oriented. Airway Examination: normal                         oropharyngeal airway and neck mobility. Respiratory                         Examination: clear to auscultation. CV Examination:  normal. Prophylactic Antibiotics: The patient does not                         require prophylactic antibiotics. Prior                         Anticoagulants: The patient has taken Plavix                           (clopidogrel ), last dose was 4 days prior to                         procedure. ASA Grade Assessment: III - A patient with                         severe systemic disease. After reviewing the risks and                         benefits, the patient was deemed in satisfactory                         condition to undergo the procedure. The anesthesia                         plan was to use monitored anesthesia care (MAC).                         Immediately prior to administration of medications,                         the patient was re-assessed for adequacy to receive                         sedatives. The heart rate, respiratory rate, oxygen                         saturations, blood pressure, adequacy of pulmonary                         ventilation, and response to care were monitored                         throughout the procedure. The physical status of the                         patient was re-assessed after the procedure.                        After obtaining informed consent, the endoscope was                         passed under direct vision. Throughout the procedure,                         the patient's blood pressure, pulse, and oxygen                         saturations were monitored continuously. The Endoscope  was introduced through the mouth, and advanced to the                         second part of duodenum. The upper GI endoscopy was                         accomplished without difficulty. The patient tolerated                         the procedure well. Findings:      A small hiatal hernia was present.      The exam of the esophagus was otherwise normal.      Patchy moderate inflammation characterized by adherent blood and       erythema was found in the gastric fundus, in the gastric body and in the       gastric antrum. Biopsies were taken with a cold forceps for Helicobacter       pylori testing. Estimated blood loss was minimal.      The  examined duodenum was normal. Impression:            - Small hiatal hernia.                        - Gastritis. Biopsied.                        - Normal examined duodenum. Recommendation:        - Discharge patient to home.                        - Resume previous diet.                        - Resume Plavix  (clopidogrel ) at prior dose today.                        - Await pathology results.                        - Return to referring physician as previously                         scheduled. Procedure Code(s):     --- Professional ---                        [phone], Esophagogastroduodenoscopy, flexible,                         transoral; with biopsy, single or multiple Diagnosis Code(s):     --- Professional ---                        K44.9, Diaphragmatic hernia without obstruction or                         gangrene                        K29.70, Gastritis, unspecified, without bleeding                        D50.9,  Iron deficiency anemia, unspecified CPT copyright 2022 American Medical Association. All rights reserved. The codes documented in this report are preliminary and upon coder review may  be revised to meet current compliance requirements. Ole Schick MD, MD 10/22/2023 12:47:10 PM Number of Addenda: 0 Note Initiated On: 10/22/2023 12:11 PM Estimated Blood Loss:  Estimated blood loss was minimal.      [facility]

## 2023-10-22 NOTE — Op Note (Signed)
 [facility] Gastroenterology Patient Name: [name] [name] Procedure Date: 10/22/2023 12:08 PM MRN: [id number] Account #: [id number] Date of Birth: 1945/06/11 Admit Type: Outpatient Age: 78 Room: [facility] ENDO ROOM 3 Gender: Female Note Status: Supervisor Override Instrument Name: Colon Scope [id number] Procedure:             Colonoscopy Indications:           Heme positive stool, Iron deficiency anemia Providers:             Ole Schick MD, MD Referring MD:          Charlene CROME. Hamrick (Referring MD) Medicines:             Monitored Anesthesia Care Complications:         No immediate complications. Estimated blood loss:                         Minimal. Procedure:             Pre-Anesthesia Assessment:                        - Prior to the procedure, a History and Physical was                         performed, and patient medications and allergies were                         reviewed. The patient is competent. The risks and                         benefits of the procedure and the sedation options and                         risks were discussed with the patient. All questions                         were answered and informed consent was obtained.                         Patient identification and proposed procedure were                         verified by the physician, the nurse, the                         anesthesiologist, the anesthetist and the technician                         in the endoscopy suite. Mental Status Examination:                         alert and oriented. Airway Examination: normal                         oropharyngeal airway and neck mobility. Respiratory                         Examination: clear to auscultation. CV Examination:  normal. Prophylactic Antibiotics: The patient does not                         require prophylactic antibiotics. Prior                         Anticoagulants: The patient has taken Plavix                           (clopidogrel ), last dose was 4 days prior to                         procedure. ASA Grade Assessment: III - A patient with                         severe systemic disease. After reviewing the risks and                         benefits, the patient was deemed in satisfactory                         condition to undergo the procedure. The anesthesia                         plan was to use monitored anesthesia care (MAC).                         Immediately prior to administration of medications,                         the patient was re-assessed for adequacy to receive                         sedatives. The heart rate, respiratory rate, oxygen                         saturations, blood pressure, adequacy of pulmonary                         ventilation, and response to care were monitored                         throughout the procedure. The physical status of the                         patient was re-assessed after the procedure.                        After obtaining informed consent, the colonoscope was                         passed under direct vision. Throughout the procedure,                         the patient's blood pressure, pulse, and oxygen                         saturations were monitored continuously. The  Colonoscope was introduced through the anus and                         advanced to the the terminal ileum, with                         identification of the appendiceal orifice and IC                         valve. The colonoscopy was performed without                         difficulty. The patient tolerated the procedure well.                         The quality of the bowel preparation was good. The                         terminal ileum, ileocecal valve, appendiceal orifice,                         and rectum were photographed. Findings:      The perianal and digital rectal examinations were normal.      The terminal ileum  appeared normal.      Multiple small-mouthed diverticula were found in the sigmoid colon and       descending colon.      A 4 mm polyp was found in the sigmoid colon. The polyp was       semi-pedunculated. The polyp was removed with a cold snare. Resection       and retrieval were complete. Estimated blood loss was minimal.      Internal hemorrhoids were found during retroflexion. The hemorrhoids       were Grade I (internal hemorrhoids that do not prolapse).      The exam was otherwise without abnormality on direct and retroflexion       views. Impression:            - The examined portion of the ileum was normal.                        - Diverticulosis in the sigmoid colon and in the                         descending colon.                        - One 4 mm polyp in the sigmoid colon, removed with a                         cold snare. Resected and retrieved.                        - Internal hemorrhoids.                        - The examination was otherwise normal on direct and                         retroflexion views. Recommendation:        -  Discharge patient to home.                        - Resume previous diet.                        - Continue present medications.                        - Await pathology results.                        - Repeat colonoscopy is not recommended due to current                         age (72 years or older) for surveillance.                        - Return to referring physician as previously                         scheduled. Procedure Code(s):     --- Professional ---                        [phone], Colonoscopy, flexible; with removal of                         tumor(s), polyp(s), or other lesion(s) by snare                         technique Diagnosis Code(s):     --- Professional ---                        K64.0, First degree hemorrhoids                        D12.5, Benign neoplasm of sigmoid colon                        D50.9, Iron  deficiency anemia, unspecified                        K57.30, Diverticulosis of large intestine without                         perforation or abscess without bleeding CPT copyright 2022 American Medical Association. All rights reserved. The codes documented in this report are preliminary and upon coder review may  be revised to meet current compliance requirements. Ole Schick MD, MD 10/22/2023 12:50:01 PM Number of Addenda: 0 Note Initiated On: 10/22/2023 12:08 PM Scope Withdrawal Time: 0 hours 5 minutes 53 seconds  Total Procedure Duration: 0 hours 11 minutes 31 seconds  Estimated Blood Loss:  Estimated blood loss was minimal.      [facility]

## 2023-10-22 NOTE — H&P (Signed)
 Outpatient short stay form Pre-procedure 10/22/2023  Ole ONEIDA Schick, MD  Primary Physician: Stephanie Charlene CROME, MD  Reason for visit:  IDA  History of present illness:    78 y/o lady with history of PVD on plavix  with last dose 4 days ago, COPD, hypertension, and HLD here for EGD/Colonoscopy for IDA. Never had these procedures done before. No family history of GI malignancies. History of hysterectomy.    Current Facility-Administered Medications:    0.9 %  sodium chloride  infusion, , Intravenous, Continuous, Dolora Ridgely, Ole ONEIDA, MD, Last Rate: 20 mL/hr at 10/22/23 1149, New Bag at 10/22/23 1149  Medications Prior to Admission  Medication Sig Dispense Refill Last Dose/Taking   albuterol  (VENTOLIN  HFA) 108 (90 Base) MCG/ACT inhaler Inhale 2 puffs into the lungs every 4 (four) hours as needed for wheezing or shortness of breath. 8 g 1 10/21/2023   losartan (COZAAR) 50 MG tablet Take 100 mg by mouth daily.   10/21/2023   metFORMIN (GLUCOPHAGE-XR) 500 MG 24 hr tablet Take 750 mg by mouth daily.   Past Week   pravastatin (PRAVACHOL) 80 MG tablet Take 40 mg by mouth daily.   10/21/2023   SPIRIVA HANDIHALER 18 MCG inhalation capsule Place 1 puff into inhaler and inhale daily as needed.   10/21/2023   acetaminophen (TYLENOL) 325 MG tablet Take 325 mg by mouth every 6 (six) hours as needed.      aspirin  EC 81 MG tablet Take 1 tablet (81 mg total) by mouth daily. 90 tablet 3    Blood Glucose Calibration (ONETOUCH VERIO) High SOLN       clopidogrel  (PLAVIX ) 75 MG tablet Take 1 tablet by mouth once daily 90 tablet 3 10/17/2023   ezetimibe  (ZETIA ) 10 MG tablet Take 1 tablet (10 mg total) by mouth daily. 90 tablet 3    Multiple Vitamin (MULTIVITAMIN) capsule Take 1 capsule by mouth daily.      Omega-3 Fatty Acids (FISH OIL PO) Take by mouth daily.      ONETOUCH VERIO test strip 1 each daily.        Allergies  Allergen Reactions   Codeine Photosensitivity   Crestor [Rosuvastatin] Other (See  Comments)    Muscle pain   Lisinopril Cough     Past Medical History:  Diagnosis Date   COPD (chronic obstructive pulmonary disease) (HCC)    Diabetes mellitus without complication (HCC)    Emphysema of lung (HCC)    Hyperlipidemia    Hypertension    Motion sickness    planes   Personal history of tobacco use, presenting hazards to health 04/01/2015   PVD (peripheral vascular disease) (HCC)    abdominal aorta stent   Vertigo    none for over 1 year   Wears dentures    full upper and lower    Review of systems:  Otherwise negative.    Physical Exam  Gen: Alert, oriented. Appears stated age.  HEENT: PERRLA. Lungs: No respiratory distress CV: RRR Abd: soft, benign, no masses Ext: No edema    Planned procedures: Proceed with EGD/colonoscopy. The patient understands the nature of the planned procedure, indications, risks, alternatives and potential complications including but not limited to bleeding, infection, perforation, damage to internal organs and possible oversedation/side effects from anesthesia. The patient agrees and gives consent to proceed.  Please refer to procedure notes for findings, recommendations and patient disposition/instructions.     Ole ONEIDA Schick, MD [facility] Gastroenterology

## 2023-10-22 NOTE — Transfer of Care (Signed)
 Immediate Anesthesia Transfer of Care Note  Patient: [name] [name]  Procedure(s) Performed: COLONOSCOPY EGD (ESOPHAGOGASTRODUODENOSCOPY) POLYPECTOMY, INTESTINE  Patient Location: Endoscopy Unit  Anesthesia Type:General  Level of Consciousness: drowsy and patient cooperative  Airway & Oxygen Therapy: Patient Spontanous Breathing and Patient connected to face mask oxygen  Post-op Assessment: Report given to RN, Post -op Vital signs reviewed and stable, and Patient moving all extremities X 4  Post vital signs: Reviewed and stable  Last Vitals:  Vitals Value Taken Time  BP 154/70 10/22/23 12:45  Temp    Pulse 80 10/22/23 12:45  Resp 19 10/22/23 12:45  SpO2 98 % 10/22/23 12:45  Vitals shown include unfiled device data.  Last Pain:  Vitals:   10/22/23 1136  TempSrc: Temporal  PainSc: 0-No pain         Complications: No notable events documented.

## 2023-10-23 ENCOUNTER — Encounter: Payer: Self-pay | Admitting: Gastroenterology

## 2023-10-23 LAB — SURGICAL PATHOLOGY

## 2023-10-25 NOTE — Anesthesia Postprocedure Evaluation (Signed)
 Anesthesia Post Note  Patient: Stacey J Vandeberg  Procedure(s) Performed: COLONOSCOPY EGD (ESOPHAGOGASTRODUODENOSCOPY) POLYPECTOMY, INTESTINE  Patient location during evaluation: PACU Anesthesia Type: General Level of consciousness: awake and alert Pain management: pain level controlled Vital Signs Assessment: post-procedure vital signs reviewed and stable Respiratory status: spontaneous breathing, nonlabored ventilation, respiratory function stable and patient connected to nasal cannula oxygen Cardiovascular status: blood pressure returned to baseline and stable Postop Assessment: no apparent nausea or vomiting Anesthetic complications: no   No notable events documented.   Last Vitals:  Vitals:   10/22/23 1256 10/22/23 1307  BP: (!) 149/67 (!) 147/62  Pulse: 79 76  Resp: 19 (!) 23  Temp:    SpO2: 100% 99%    Last Pain:  Vitals:   10/23/23 0738  TempSrc:   PainSc: 0-No pain                 Lynwood KANDICE Clause

## 2023-12-31 DIAGNOSIS — N390 Urinary tract infection, site not specified: Secondary | ICD-10-CM | POA: Diagnosis not present

## 2024-01-11 DIAGNOSIS — Z23 Encounter for immunization: Secondary | ICD-10-CM | POA: Diagnosis not present

## 2024-01-11 LAB — LAB REPORT - SCANNED
A1c: 6
EGFR: 90

## 2024-01-23 ENCOUNTER — Ambulatory Visit

## 2024-01-23 DIAGNOSIS — W908XXA Exposure to other nonionizing radiation, initial encounter: Secondary | ICD-10-CM

## 2024-01-23 DIAGNOSIS — C4491 Basal cell carcinoma of skin, unspecified: Secondary | ICD-10-CM

## 2024-01-23 DIAGNOSIS — C44319 Basal cell carcinoma of skin of other parts of face: Secondary | ICD-10-CM

## 2024-01-23 DIAGNOSIS — L814 Other melanin hyperpigmentation: Secondary | ICD-10-CM | POA: Diagnosis not present

## 2024-01-23 DIAGNOSIS — D489 Neoplasm of uncertain behavior, unspecified: Secondary | ICD-10-CM

## 2024-01-23 DIAGNOSIS — D229 Melanocytic nevi, unspecified: Secondary | ICD-10-CM

## 2024-01-23 DIAGNOSIS — L578 Other skin changes due to chronic exposure to nonionizing radiation: Secondary | ICD-10-CM

## 2024-01-23 DIAGNOSIS — D1801 Hemangioma of skin and subcutaneous tissue: Secondary | ICD-10-CM

## 2024-01-23 DIAGNOSIS — L821 Other seborrheic keratosis: Secondary | ICD-10-CM

## 2024-01-23 DIAGNOSIS — Z85828 Personal history of other malignant neoplasm of skin: Secondary | ICD-10-CM

## 2024-01-23 HISTORY — DX: Basal cell carcinoma of skin, unspecified: C44.91

## 2024-01-23 NOTE — Progress Notes (Signed)
 Subjective   [name] [name] is a 78 y.o. female who presents for the following: Lesion(s) of concern . Patient is new patient  Today patient reports: Area of concern on the face.   Review of Systems:    No other skin or systemic complaints except as noted in HPI or Assessment and Plan.  The following portions of the chart were reviewed this encounter and updated as appropriate: medications, allergies, medical history  Relevant Medical History:  Personal history of non melanoma skin cancer - see medical history for full details   Objective  (SKPE) Well appearing patient in no apparent distress; mood and affect are within normal limits. Examination was performed of the: Sun Exposed Exam: Scalp, head, eyes, ears, nose, lips, neck, upper extremities, hands, fingers, fingernails  Examination notable for: SKIN EXAM, Angioma(s): Scattered red vascular papule(s)  , Lentigo/lentigines: Scattered pigmented macules that are tan to brown in color and are somewhat non-uniform in shape and concentrated in the sun-exposed areas, Nevus/nevi: Scattered well-demarcated, regular, pigmented macule(s) and/or papule(s)  , Seborrheic Keratosis(es): Stuck-on appearing keratotic papule(s) on the trunk, none  irritated with redness, crusting, edema, and/or partial avulsion, Actinic Damage/Elastosis: chronic sun damage: dyspigmentation, telangiectasia, and wrinkling  Examination limited by: Clothing and Patient deferred removal     Right Temple 1cm pink scaly plaque    Assessment & Plan  (SKAP)   BENIGN SKIN FINDINGS  - Lentigines  - Seborrheic keratoses  - Hemangiomas   - Nevus/Multiple Benign Nevi - Reassurance provided regarding the benign appearance of lesions noted on exam today; no treatment is indicated in the absence of symptoms/changes. - Reinforced importance of photoprotective strategies including liberal and frequent sunscreen use of a broad-spectrum SPF 30 or greater, use of protective  clothing, and sun avoidance for prevention of cutaneous malignancy and photoaging.  Counseled patient on the importance of regular self-skin monitoring as well as routine clinical skin examinations as scheduled.   ACTINIC DAMAGE - Chronic condition, secondary to cumulative UV/sun exposure - Recommend daily broad spectrum sunscreen SPF 30+ to sun-exposed areas, reapply every 2 hours as needed.  - Staying in the shade or wearing long sleeves, sun glasses (UVA+UVB protection) and wide brim hats (4-inch brim around the entire circumference of the hat) are also recommended for sun protection.  - Call for new or changing lesions. - Recommended FBSE q41mo iso likely NMSC as biopsied below   Personal history of non melanoma skin cancer  - Reviewed medical history for full details  - Reviewed sun protective measures as above - Encouraged full body skin exams     Level of service outlined above   Patient instructions (SKPI)   Procedures, orders, diagnosis for this visit:  NEOPLASM OF UNCERTAIN BEHAVIOR Right Temple Skin / nail biopsy Type of biopsy: tangential   Informed consent: discussed and consent obtained   Timeout: patient name, date of birth, surgical site, and procedure verified   Procedure prep:  Patient was prepped and draped in usual sterile fashion Prep type:  Isopropyl alcohol Anesthesia: the lesion was anesthetized in a standard fashion   Anesthetic:  1% lidocaine  w/ epinephrine  1-100,000 buffered w/ 8.4% NaHCO3 Instrument used: DermaBlade   Hemostasis achieved with: pressure and aluminum chloride   Outcome: patient tolerated procedure well   Post-procedure details: sterile dressing applied and wound care instructions given   Dressing type: bandage and petrolatum    Specimen 1 - Surgical pathology Differential Diagnosis: BCC vs SCC vs AK  Check Margins: No  Neoplasm of uncertain behavior -     Skin / nail biopsy -     Surgical pathology; Standing    Return to clinic:  Return in about 6 months (around 07/22/2024) for TBSE.  I, Emerick Ege, CMA am acting as scribe for Lauraine JAYSON Kanaris, MD.   Documentation: I have reviewed the above documentation for accuracy and completeness, and I agree with the above.  Lauraine JAYSON Kanaris, MD

## 2024-01-23 NOTE — Patient Instructions (Addendum)

## 2024-01-25 LAB — SURGICAL PATHOLOGY

## 2024-01-28 ENCOUNTER — Ambulatory Visit: Payer: Self-pay

## 2024-01-28 DIAGNOSIS — C44319 Basal cell carcinoma of skin of other parts of face: Secondary | ICD-10-CM

## 2024-01-29 NOTE — Telephone Encounter (Signed)
 Patient informed of pathology results and referral sent to Dr. Shawn Delay office.

## 2024-01-29 NOTE — Telephone Encounter (Signed)
-----   Message from Lauraine JAYSON Kanaris sent at 01/28/2024  8:54 AM EST -----  1. Skin, right temple :       BASAL CELL CARCINOMA, SUPERFICIAL AND NODULAR PATTERNS  Please notify patient with below plan: Mohs   ----- Message ----- From: Interface, Lab In Three Zero One Sent: 01/25/2024   4:20 PM EST To: Lauraine JAYSON Kanaris, MD

## 2024-02-28 ENCOUNTER — Other Ambulatory Visit: Payer: Self-pay

## 2024-02-28 ENCOUNTER — Emergency Department
Admission: EM | Admit: 2024-02-28 | Discharge: 2024-02-28 | Disposition: A | Discharge status: Home / Self Care | Source: Home / Self Care

## 2024-02-28 ENCOUNTER — Emergency Department

## 2024-02-28 DIAGNOSIS — R059 Cough, unspecified: Secondary | ICD-10-CM | POA: Diagnosis present

## 2024-02-28 DIAGNOSIS — J441 Chronic obstructive pulmonary disease with (acute) exacerbation: Secondary | ICD-10-CM | POA: Diagnosis not present

## 2024-02-28 LAB — BASIC METABOLIC PANEL WITH GFR
Anion gap: 11 (ref 5–15)
BUN: 7 mg/dL — ABNORMAL LOW (ref 8–23)
CO2: 24 mmol/L (ref 22–32)
Calcium: 8.7 mg/dL — ABNORMAL LOW (ref 8.9–10.3)
Chloride: 92 mmol/L — ABNORMAL LOW (ref 98–111)
Creatinine, Ser: 0.62 mg/dL (ref 0.44–1.00)
GFR, Estimated: >60 mL/min (ref >60)
Glucose, Bld: 154 mg/dL — ABNORMAL HIGH (ref 70–99)
Potassium: 4.1 mmol/L (ref 3.5–5.1)
Sodium: 127 mmol/L — ABNORMAL LOW (ref 135–145)

## 2024-02-28 LAB — RESP PANEL BY RT-PCR (RSV, FLU A&B, COVID)  RVPGX2
Influenza A by PCR: NEGATIVE
Influenza B by PCR: NEGATIVE
Resp Syncytial Virus by PCR: NEGATIVE
SARS Coronavirus 2 by RT PCR: NEGATIVE

## 2024-02-28 LAB — CBC
HCT: 39.6 % (ref 36.0–46.0)
Hemoglobin: 13.9 g/dL (ref 12.0–15.0)
MCH: 30.9 pg (ref 26.0–34.0)
MCHC: 35.1 g/dL (ref 30.0–36.0)
MCV: 88 fL (ref 80.0–100.0)
Platelets: 367 K/uL (ref 150–400)
RBC: 4.5 MIL/uL (ref 3.87–5.11)
RDW: 12.4 % (ref 11.5–15.5)
WBC: 11.3 K/uL — ABNORMAL HIGH (ref 4.0–10.5)
nRBC: 0 % (ref 0.0–0.2)

## 2024-02-28 MED ORDER — IPRATROPIUM-ALBUTEROL 0.5-2.5 (3) MG/3ML IN SOLN
3.0000 mL | Freq: Once | RESPIRATORY_TRACT | Status: AC
  Administered 2024-02-28: 18:00:00 3 mL via RESPIRATORY_TRACT
  Filled 2024-02-28: qty 3

## 2024-02-28 MED ORDER — PREDNISONE 10 MG PO TABS: ORAL_TABLET | ORAL | 0 refills | Status: DC

## 2024-02-28 MED ORDER — SODIUM CHLORIDE 0.9 % IV BOLUS: 1000.0000 mL | Freq: Once | INTRAVENOUS | Status: DC

## 2024-02-28 MED ORDER — PREDNISONE 20 MG PO TABS
60.0000 mg | ORAL_TABLET | Freq: Once | ORAL | Status: AC
  Administered 2024-02-28: 18:00:00 60 mg via ORAL
  Filled 2024-02-28: qty 3

## 2024-02-28 NOTE — ED Triage Notes (Signed)
 Pt to ED for shob for a few weeks. Hx COPD Reports fevers the last week

## 2024-02-28 NOTE — Discharge Instructions (Signed)
 You were seen today due to concern of shortness of breath.  I suspect this is likely from your COPD.  Please be sure to take the steroids I have written for you as instructed.  If you have any worsening of symptoms such as increased shortness of breath, chest pain, or any other symptoms you find concerning please return to the emergency department immediately for further medical management.  Otherwise be sure to follow-up with your primary doctor within the next 3 to 4 days for reevaluation of symptoms.

## 2024-02-28 NOTE — ED Provider Notes (Signed)
 [facility] Provider Note    Event Date/Time   First MD Initiated Contact with Patient 02/28/24 1700     (approximate)   History   Shortness of Breath   HPI  [name] [name] is a 78 y.o. female who presents with concern of shortness of breath.  Apparently over the last 2 weeks having cough and increased dyspnea on exertion.  No known sick contacts, she states that she was worried she has some sort of infection causing her to present at this time for evaluation.  Has not had a productive cough.  Denies associated fevers chills nausea vomiting.  She does have an inhaler at home for history of COPD which she states she has been using more frequently and seems to briefly help with her symptoms.  Has not had any swelling in her extremities no prior history of PEs or DVTs.  No known history of cardiac disease and denies associated chest pain.     Physical Exam   Triage Vital Signs: ED Triage Vitals  Encounter Vitals Group     BP 02/28/24 1430 (!) 188/88     Girls Systolic BP Percentile --      Girls Diastolic BP Percentile --      Boys Systolic BP Percentile --      Boys Diastolic BP Percentile --      Pulse Rate 02/28/24 1430 87     Resp 02/28/24 1430 (!) 22     Temp 02/28/24 1430 97.7 F (36.5 C)     Temp src --      SpO2 02/28/24 1430 93 %     Weight 02/28/24 1431 140 lb (63.5 kg)     Height 02/28/24 1431 5' 4 (1.626 m)     Head Circumference --      Peak Flow --      Pain Score 02/28/24 1430 0     Pain Loc --      Pain Education --      Exclude from Growth Chart --     Most recent vital signs: Vitals:   02/28/24 1430 02/28/24 1720  BP: (!) 188/88 (!) 151/76  Pulse: 87 66  Resp: (!) 22 19  Temp: 97.7 F (36.5 C) 97.7 F (36.5 C)  SpO2: 93% 97%     General: Awake, no distress.  CV:  Good peripheral perfusion.  No noted lower extremity swelling Resp:  Normal effort.  Able to speak in full sentences, no noted tachypnea, faint wheezes are  appreciated in the lower lobes, but no significant respiratory distress is noted Abd:  No distention.  Soft nontender Other:     ED Results / Procedures / Treatments   Labs (all labs ordered are listed, but only abnormal results are displayed) Labs Reviewed  BASIC METABOLIC PANEL WITH GFR - Abnormal; Notable for the following components:      Result Value   Sodium 127 (*)    Chloride 92 (*)    Glucose, Bld 154 (*)    BUN 7 (*)    Calcium 8.7 (*)    All other components within normal limits  CBC - Abnormal; Notable for the following components:   WBC 11.3 (*)    All other components within normal limits  RESP PANEL BY RT-PCR (RSV, FLU A&B, COVID)  [id number]     EKG  Sinus rhythm with rate of about 75, axis of about 75, intervals appear to be within normal limits, no obvious ischemia and  I appreciate this EKG   RADIOLOGY No acute cardiopulmonary findings  PROCEDURES:  Critical Care performed: No  Procedures   MEDICATIONS ORDERED IN ED: Medications  ipratropium-albuterol  (DUONEB) 0.5-2.5 (3) MG/3ML nebulizer solution 3 mL (3 mLs Nebulization Given 02/28/24 1754)  predniSONE  (DELTASONE ) tablet 60 mg (60 mg Oral Given 02/28/24 1754)     IMPRESSION / MDM / ASSESSMENT AND PLAN / ED COURSE  I reviewed the triage vital signs and the nursing notes.                               Patient's presentation is most consistent with exacerbation of chronic illness.  78 year old female who presents today with concern of shortness of breath and cough ongoing over the last 2 weeks.  She appears well she is not in any acute distress her vitals here are reassuring.  She does have some decreased air movement and faint wheezes in the lower lobes, primarily concerning for possibly of COPD exacerbation.  Will give a nebulizer and steroids here, she has slight hyponatremia she states that she has been drinking adequate amounts of water.  Will follow-up response to treatment of nebulizer and  have the patient ambulated to assist in determining safe disposition.  Clinical Course as of 02/28/24 2354  Thu Feb 28, 2024  1826 Patient's work of breathing is much improved at this time, she tells me she feels much better, we will give her a brief course of steroids for home believe she can be discharged home.  I discussed return precautions. [SK]    Clinical Course User Index [SK] Fernand Rossie HERO, MD     FINAL CLINICAL IMPRESSION(S) / ED DIAGNOSES   Final diagnoses:  COPD exacerbation (HCC)     Rx / DC Orders   ED Discharge Orders          Ordered    predniSONE  (DELTASONE ) 10 MG tablet  Daily,   Status:  Discontinued        02/28/24 1828    predniSONE  (DELTASONE ) 10 MG tablet  Daily        02/28/24 1921             Note:  This document was prepared using Dragon voice recognition software and may include unintentional dictation errors.   Fernand Rossie HERO, MD 02/28/24 [id number]

## 2024-03-20 ENCOUNTER — Encounter (HOSPITAL_COMMUNITY): Payer: Self-pay

## 2024-03-24 ENCOUNTER — Encounter: Payer: Self-pay | Admitting: Dermatology

## 2024-03-25 ENCOUNTER — Ambulatory Visit: Admitting: Dermatology

## 2024-03-25 ENCOUNTER — Encounter: Payer: Self-pay | Admitting: Dermatology

## 2024-03-25 VITALS — BP 149/77 | HR 85 | Temp 98.0°F

## 2024-03-25 DIAGNOSIS — C44319 Basal cell carcinoma of skin of other parts of face: Secondary | ICD-10-CM | POA: Diagnosis not present

## 2024-03-25 DIAGNOSIS — L578 Other skin changes due to chronic exposure to nonionizing radiation: Secondary | ICD-10-CM

## 2024-03-25 DIAGNOSIS — C4491 Basal cell carcinoma of skin, unspecified: Secondary | ICD-10-CM

## 2024-03-25 DIAGNOSIS — L814 Other melanin hyperpigmentation: Secondary | ICD-10-CM | POA: Diagnosis not present

## 2024-03-25 NOTE — Patient Instructions (Signed)

## 2024-03-25 NOTE — Progress Notes (Signed)
 "  Follow-Up Visit   Subjective  [name] [name] is a 79 y.o. 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 Biopsy was performed on 01/23/2024 by Lauraine Kanaris, MD.  The following portions of the chart were reviewed this encounter and updated as appropriate: medications, allergies, medical history  Review of Systems:  No other skin or systemic complaints except as noted in HPI or Assessment and Plan.  Pt reports lesion has been present for approximately 6 months and denies previous treatment. Pt denies pain and itchiness.   Pt reports personal history of skin cancer. Pt denies family history of skin cancer.   Objective  Well appearing patient in no apparent distress; mood and affect are within normal limits.  A focused examination was performed of the following areas: Right temple Relevant physical exam findings are noted in the Assessment and Plan.   Right Temple Pink scaly plaque   Assessment & Plan   BASAL CELL CARCINOMA (BCC), UNSPECIFIED SITE Right Temple - Mohs surgery  Consent obtained: written  Anticoagulation: Is the patient taking prescription anticoagulant and/or aspirin  prescribed/recommended by a physician? No   Was the anticoagulation regimen changed prior to Mohs? No    Anesthesia: Anesthesia method: local infiltration Local anesthetic: lidocaine  1% WITH epi  Procedure Details: Timeout: pre-procedure verification complete Procedure Prep: patient was prepped and draped in usual sterile fashion Prep type: chlorhexidine Biopsy accession number: [id number] Biopsy lab: [facility] Date of biopsy: 01/23/2024 Frozen section biopsy performed: No   Specimen debulked: No   Pre-Op diagnosis: basal cell carcinoma BCC subtype: superficial and nodular MohsAIQ Surgical site (if tumor spans multiple areas, please select predominant area): temple Surgery side: right Surgical site (from skin  exam): Right Temple Pre-operative length (cm): 2 Pre-operative width (cm): 1.2 Indications for Mohs surgery: anatomic location where tissue conservation is critical  Micrographic Surgery Details: Post-operative length (cm): 2.8 Post-operative width (cm): 1.8 Number of Mohs stages: 1 Post surgery depth of defect: subcutaneous fat  Stage 1    Tumor features identified on Mohs section: no tumor identified  Patient tolerance of procedure: tolerated well, no immediate complications  Reconstruction: Was the defect reconstructed? Yes   Was reconstruction performed by the same Mohs surgeon? Yes   Setting of reconstruction: outpatient office When was reconstruction performed? same day Type of reconstruction: linear Linear reconstruction: complex  Opioids: Did the patient receive a prescription for opioid/narcotic related to Mohs surgery?: No   Indications for opioid/narcotics: no indications  Antibiotics: Does patient meet AHA guidelines for endocarditis?: No   Does patient meet AHA guidelines for orthopedic prophylaxis?: No   Did surgery breach mucosa, expose cartilage/bone, involve an area of lymphedema/inflamed/infected tissue? No    - Skin repair Complexity:  Complex Final length (cm):  5 Informed consent: discussed and consent obtained   Timeout: patient name, date of birth, surgical site, and procedure verified   Procedure prep:  Patient was prepped and draped in usual sterile fashion Prep type:  Chlorhexidine Anesthesia: the lesion was anesthetized in a standard fashion   Anesthetic:  1% lidocaine  w/ epinephrine  1-100,000 buffered w/ 8.4% NaHCO3 Reason for type of repair: reduce the risk of dehiscence, infection, and necrosis, preserve normal anatomy, preserve normal anatomical and functional relationships and avoid adjacent structures   Undermining: area extensively undermined   Subcutaneous layers (deep stitches):  Suture size:  5-0 Suture type: Monocryl (poliglecaprone  25)   Stitches:  Buried vertical mattress Fine/surface  layer approximation (top stitches):  Suture size:  6-0 Suture type: fast-absorbing plain gut   Stitches: simple running   Hemostasis achieved with: suture, pressure and electrodesiccation Outcome: patient tolerated procedure well with no complications   Post-procedure details: sterile dressing applied and wound care instructions given   Dressing type: pressure dressing and petrolatum      Return in about 4 weeks (around 04/22/2024) for wound check for right temple.  LILLETTE Virgle Boards, Mohs/Dermatology Tech am acting as a neurosurgeon for RUFUS CHRISTELLA HOLY, MD.   03/25/2024  HISTORY OF PRESENT ILLNESS  [name] [name] is seen in consultation at the request of Dr. Raymund for biopsy-proven Superficial and Nodular Basal Cell Carcinoma of the right temple. They note that the area has been present for about 4 months increasing in size with time.  There is no history of previous treatment.  Reports no other new or changing lesions and has no other complaints today.  Medications and allergies: see patient chart.  Review of systems: Reviewed 8 systems and notable for the above skin cancer.  All other systems reviewed are unremarkable/negative, unless noted in the HPI. Past medical history, surgical history, family history, social history were also reviewed and are noted in the chart/questionnaire.    PHYSICAL EXAMINATION  General: Well-appearing, in no acute distress, alert and oriented x 4. Vitals reviewed in chart (if available).   Skin: Exam reveals a 2.0 x 1.2 cm erythematous papule and biopsy scar on the right temple. There are rhytids, telangiectasias, and lentigines, consistent with photodamage.  Biopsy report(s) reviewed, confirming the diagnosis.   ASSESSMENT  1) Superficial and Nodular Basal Cell carcinoma of the right temple 2) photodamage 3) solar lentigines   PLAN   1. Due to location, size, histology, or recurrence and the  likelihood of subclinical extension as well as the need to conserve normal surrounding tissue, the patient was deemed acceptable for Mohs micrographic surgery (MMS).  The nature and purpose of the procedure, associated benefits and risks including recurrence and scarring, possible complications such as pain, infection, and bleeding, and alternative methods of treatment if appropriate were discussed with the patient during consent. The lesion location was verified by the patient, by reviewing previous notes, pathology reports, and by photographs as well as angulation measurements if available.  Informed consent was reviewed and signed by the patient, and timeout was performed at 9:30 AM. See op note below.  2. For the photodamage and solar lentigines, sun protection discussed/information given on OTC sunscreens, and we recommend continued regular follow-up with primary dermatologist every 6 months or sooner for any growing, bleeding, or changing lesions. 3. Prognosis and future surveillance discussed. 4. Letter with treatment outcome sent to referring provider. 5. Pain acetaminophen/ibuprofen  MOHS MICROGRAPHIC SURGERY AND RECONSTRUCTION  Initial size:   2.0 x 1.2 cm Surgical defect/wound size: 2.8 x 1.8 cm Anesthesia:    0.33% lidocaine  with 1:200,000 epinephrine  EBL:    <5 mL Complications:  None Repair type:   Complex SQ suture:   5-0 Monocryl Cutaneous suture:  6-0 Plain gut Final size of the repair: 5.0 cm  Stages: 1  STAGE I: Anesthesia achieved with 0.5% lidocaine  with 1:200,000 epinephrine . ChloraPrep applied. 1 section(s) excised using Mohs technique (this includes total peripheral and deep tissue margin excision and evaluation with frozen sections, excised and interpreted by the same physician). The tumor was first debulked and then excised with an approx. 2mm margin.  Hemostasis was achieved with electrocautery as needed.  The specimen  was then oriented, subdivided/relaxed, inked, and  processed using Mohs technique.    Frozen section analysis revealed a clear deep and peripheral margin.  Reconstruction  The surgical wound was then cleaned, prepped, and re-anesthetized as above. Wound edges were undermined extensively along at least one entire edge and at a distance equal to or greater than the width of the defect (see wound defect size above) in order to achieve closure and decrease wound tension and anatomic distortion. Redundant tissue repair including standing cone removal was performed. Hemostasis was achieved with electrocautery. Subcutaneous and epidermal tissues were approximated with the above sutures. The surgical site was then lightly scrubbed with sterile, saline-soaked gauze. Steri-strips were applied, and the area was then bandaged using Vaseline ointment, non-adherent gauze, gauze pads, and tape to provide an adequate pressure dressing. The patient tolerated the procedure well, was given detailed written and verbal wound care instructions, and was discharged in good condition.   The patient will follow-up: 4 weeks.     Documentation: I have reviewed the above documentation for accuracy and completeness, and I agree with the above.  RUFUS CHRISTELLA HOLY, MD  "

## 2024-04-01 ENCOUNTER — Other Ambulatory Visit: Payer: Self-pay | Admitting: Cardiovascular Disease

## 2024-04-01 DIAGNOSIS — I739 Peripheral vascular disease, unspecified: Secondary | ICD-10-CM

## 2024-04-23 ENCOUNTER — Ambulatory Visit: Admitting: Dermatology

## 2024-05-16 ENCOUNTER — Ambulatory Visit (HOSPITAL_COMMUNITY)

## 2024-07-14 ENCOUNTER — Ambulatory Visit
